# Patient Record
Sex: Male | Born: 1950 | Race: White | Hispanic: No | Marital: Married | Smoking: Never smoker
Health system: Southern US, Community
[De-identification: ages and names within clinical notes are randomized; demographics above are authoritative.]

## PROBLEM LIST (undated history)

## (undated) DIAGNOSIS — N401 Enlarged prostate with lower urinary tract symptoms: Secondary | ICD-10-CM

## (undated) DIAGNOSIS — L821 Other seborrheic keratosis: Secondary | ICD-10-CM

## (undated) DIAGNOSIS — R339 Retention of urine, unspecified: Secondary | ICD-10-CM

## (undated) DIAGNOSIS — N200 Calculus of kidney: Secondary | ICD-10-CM

## (undated) DIAGNOSIS — I10 Essential (primary) hypertension: Secondary | ICD-10-CM

## (undated) HISTORY — PX: HERNIA REPAIR: SHX51

---

## 2004-08-17 ENCOUNTER — Ambulatory Visit: Payer: Self-pay | Admitting: General Surgery

## 2004-10-30 ENCOUNTER — Ambulatory Visit: Payer: Self-pay | Admitting: Family Medicine

## 2005-11-04 ENCOUNTER — Other Ambulatory Visit: Payer: Self-pay

## 2005-11-11 ENCOUNTER — Ambulatory Visit: Payer: Self-pay | Admitting: Specialist

## 2005-12-02 ENCOUNTER — Ambulatory Visit: Payer: Self-pay | Admitting: Gastroenterology

## 2008-11-22 ENCOUNTER — Ambulatory Visit: Payer: Self-pay | Admitting: Specialist

## 2011-04-12 ENCOUNTER — Ambulatory Visit: Payer: Self-pay | Admitting: Urology

## 2011-10-01 ENCOUNTER — Emergency Department: Payer: Self-pay | Admitting: Emergency Medicine

## 2011-10-01 LAB — BASIC METABOLIC PANEL
Anion Gap: 13 (ref 7–16)
BUN: 15 mg/dL (ref 7–18)
Calcium, Total: 9.4 mg/dL (ref 8.5–10.1)
Chloride: 107 mmol/L (ref 98–107)
EGFR (African American): >60
EGFR (Non-African Amer.): >60
Glucose: 132 mg/dL — ABNORMAL HIGH (ref 65–99)
Osmolality: 282 (ref 275–301)

## 2011-10-01 LAB — URINALYSIS, COMPLETE
Bacteria: NONE SEEN
Bilirubin,UR: NEGATIVE
Glucose,UR: NEGATIVE mg/dL (ref 0–75)
Leukocyte Esterase: NEGATIVE
Nitrite: NEGATIVE
RBC,UR: 1131 /HPF (ref 0–5)
Specific Gravity: 1.019 (ref 1.003–1.030)
Squamous Epithelial: NONE SEEN
WBC UR: 1 /HPF (ref 0–5)

## 2011-10-06 ENCOUNTER — Ambulatory Visit: Payer: Self-pay | Admitting: Urology

## 2011-10-07 ENCOUNTER — Ambulatory Visit: Payer: Self-pay | Admitting: Urology

## 2011-10-14 ENCOUNTER — Ambulatory Visit: Payer: Self-pay | Admitting: Urology

## 2011-10-26 ENCOUNTER — Ambulatory Visit: Payer: Self-pay | Admitting: Urology

## 2011-11-01 ENCOUNTER — Ambulatory Visit: Payer: Self-pay | Admitting: Urology

## 2011-11-02 ENCOUNTER — Ambulatory Visit: Payer: Self-pay | Admitting: Urology

## 2012-04-12 ENCOUNTER — Ambulatory Visit: Payer: Self-pay | Admitting: Urology

## 2013-04-25 ENCOUNTER — Ambulatory Visit: Payer: Self-pay | Admitting: Urology

## 2013-05-05 IMAGING — CR DG ABDOMEN 1V
1 series · 2 of 2 positions shown · non-contrast
Comparison: none

REASON FOR EXAM: Calculus
COMMENTS:

PROCEDURE:     DXR - DXR KIDNEY URETER BLADDER  - October 14, 2011 [DATE]
RESULT:     Double-J left ureteral stent noted. Left nephrolithiasis. Tiny
stone in the mid left ureter cannot be excluded. Pelvic phleboliths.

[Series 1: supine kub · 0.17mm/px · 2 of 2 slices shown]
[im 1/2]
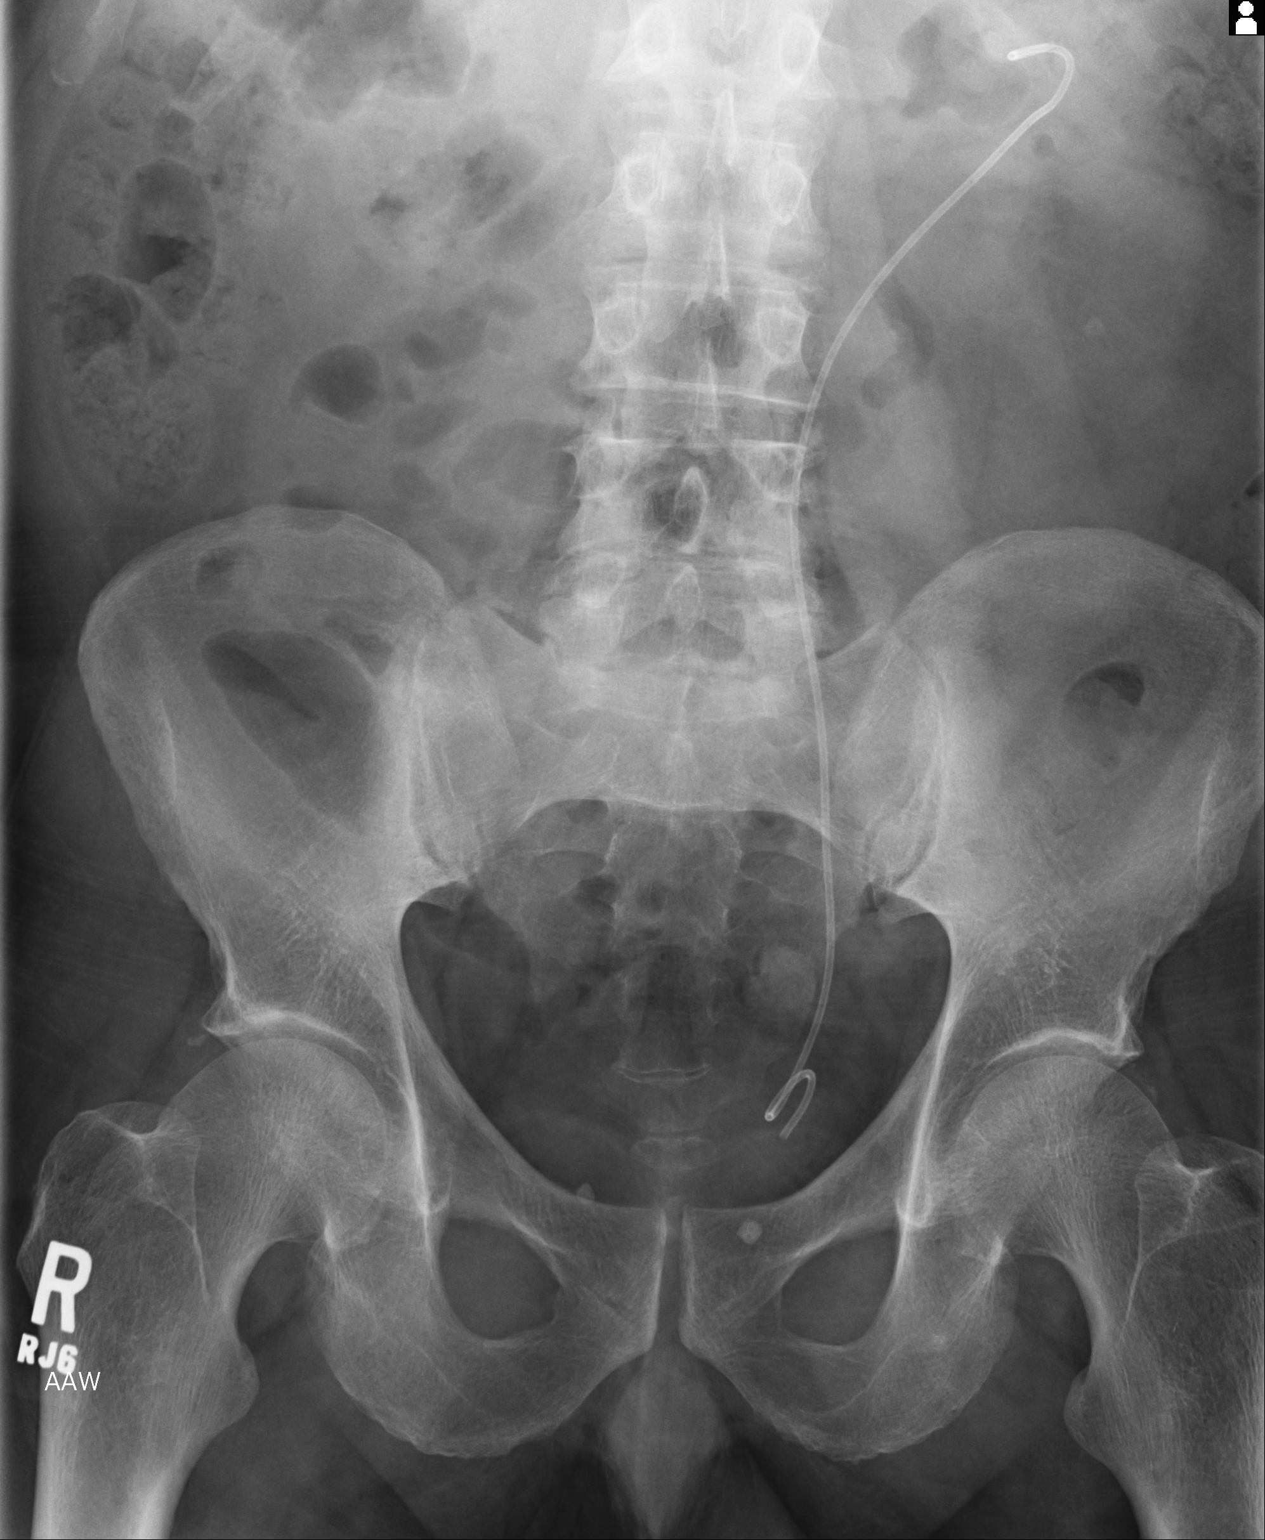
[im 2/2]
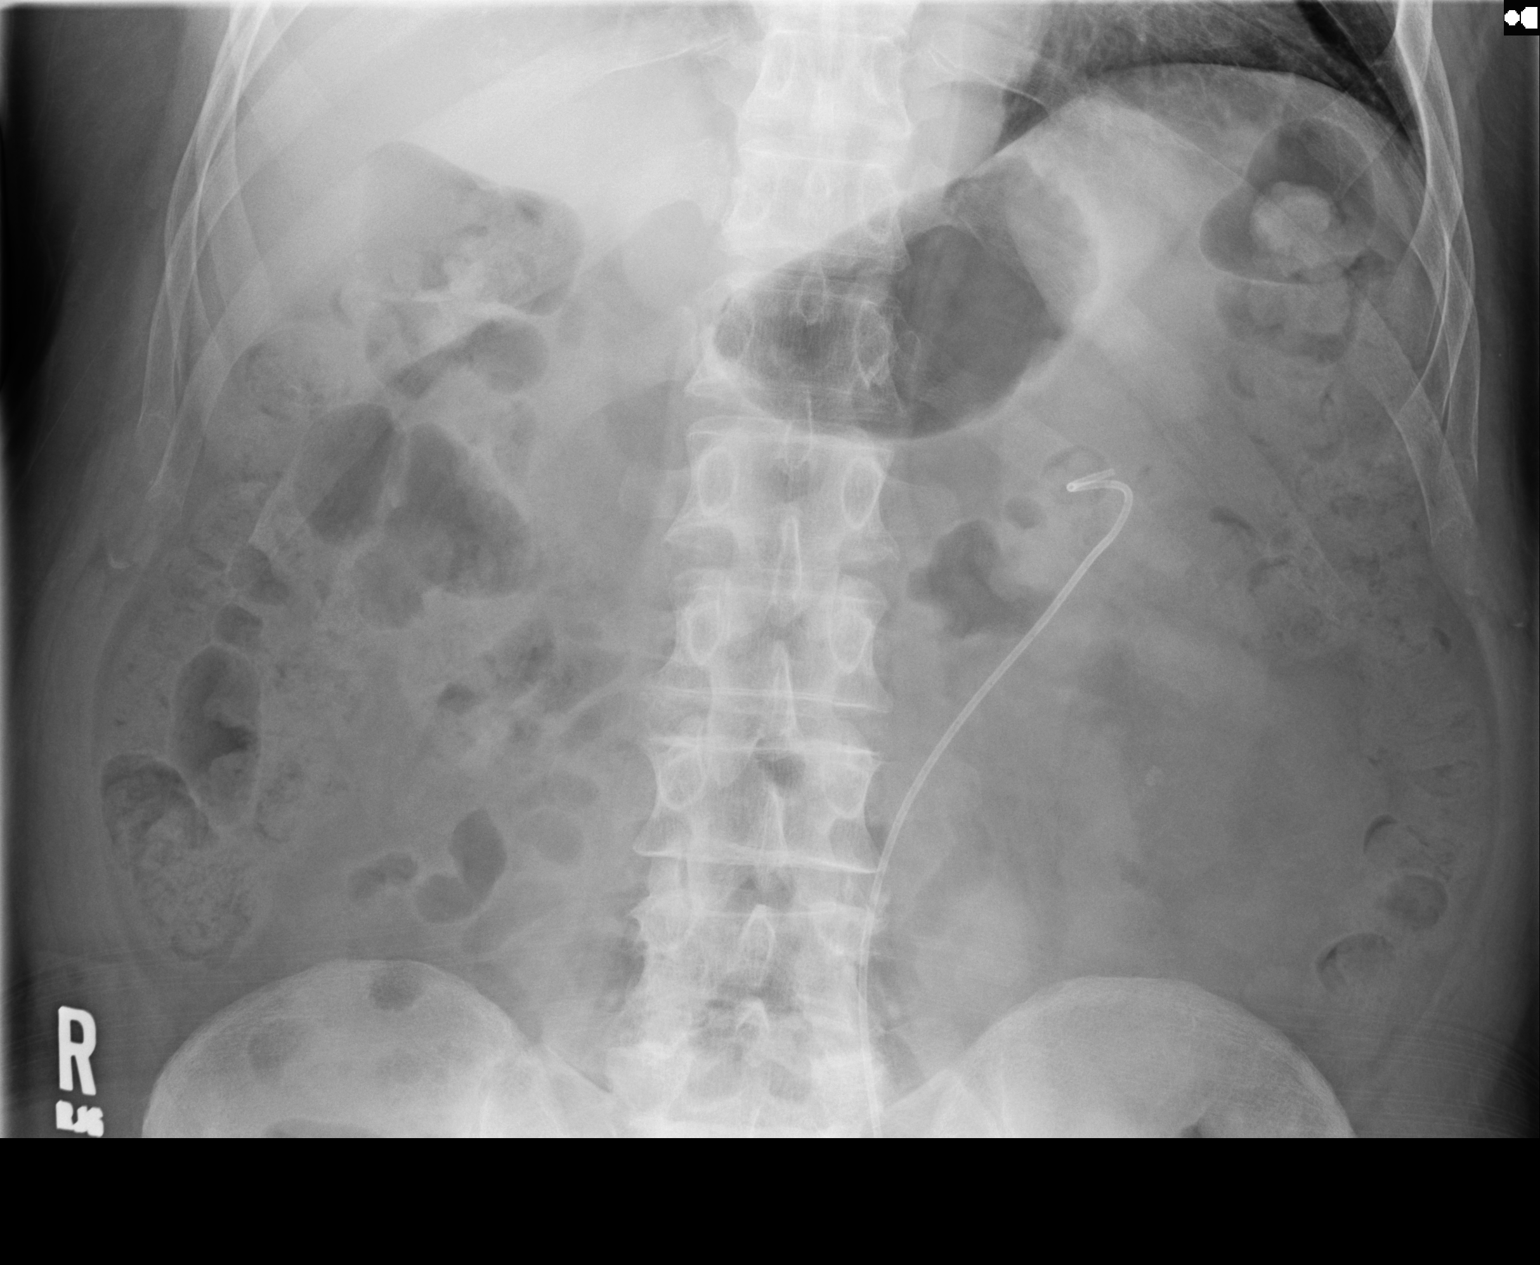

[2 of 2 positions shown; findings below may reference images not displayed]

IMPRESSION: Double-J left ureteral stent in good position. Left
nephrolithiasis. Cannot exclude tiny stone the mid left ureter.

## 2013-05-23 IMAGING — CR DG ABDOMEN 1V
1 series · 2 of 2 positions shown · non-contrast
Comparison: none

REASON FOR EXAM: Nephrolithiasis and Renal Colic
COMMENTS:

[Series 3: t abdomen supine · 0.14mm/px · 2 of 2 slices shown]
[im 1/2]
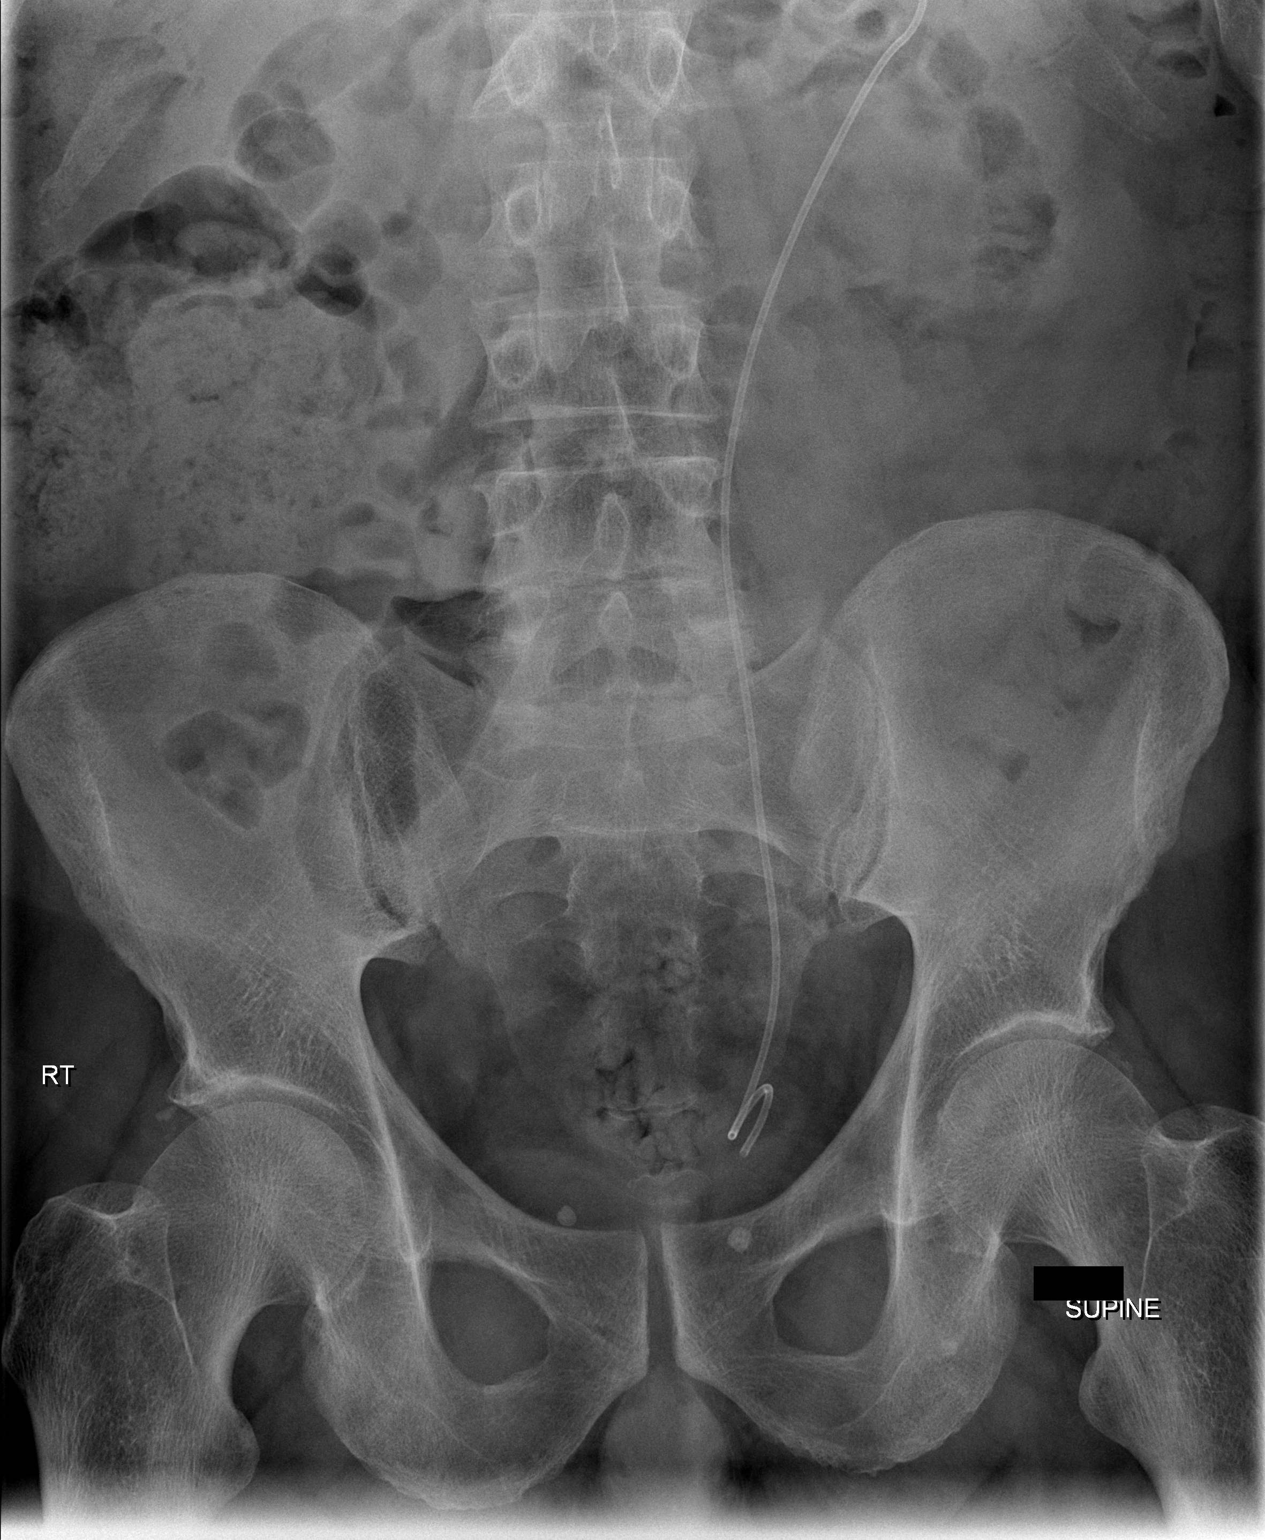
[im 2/2]
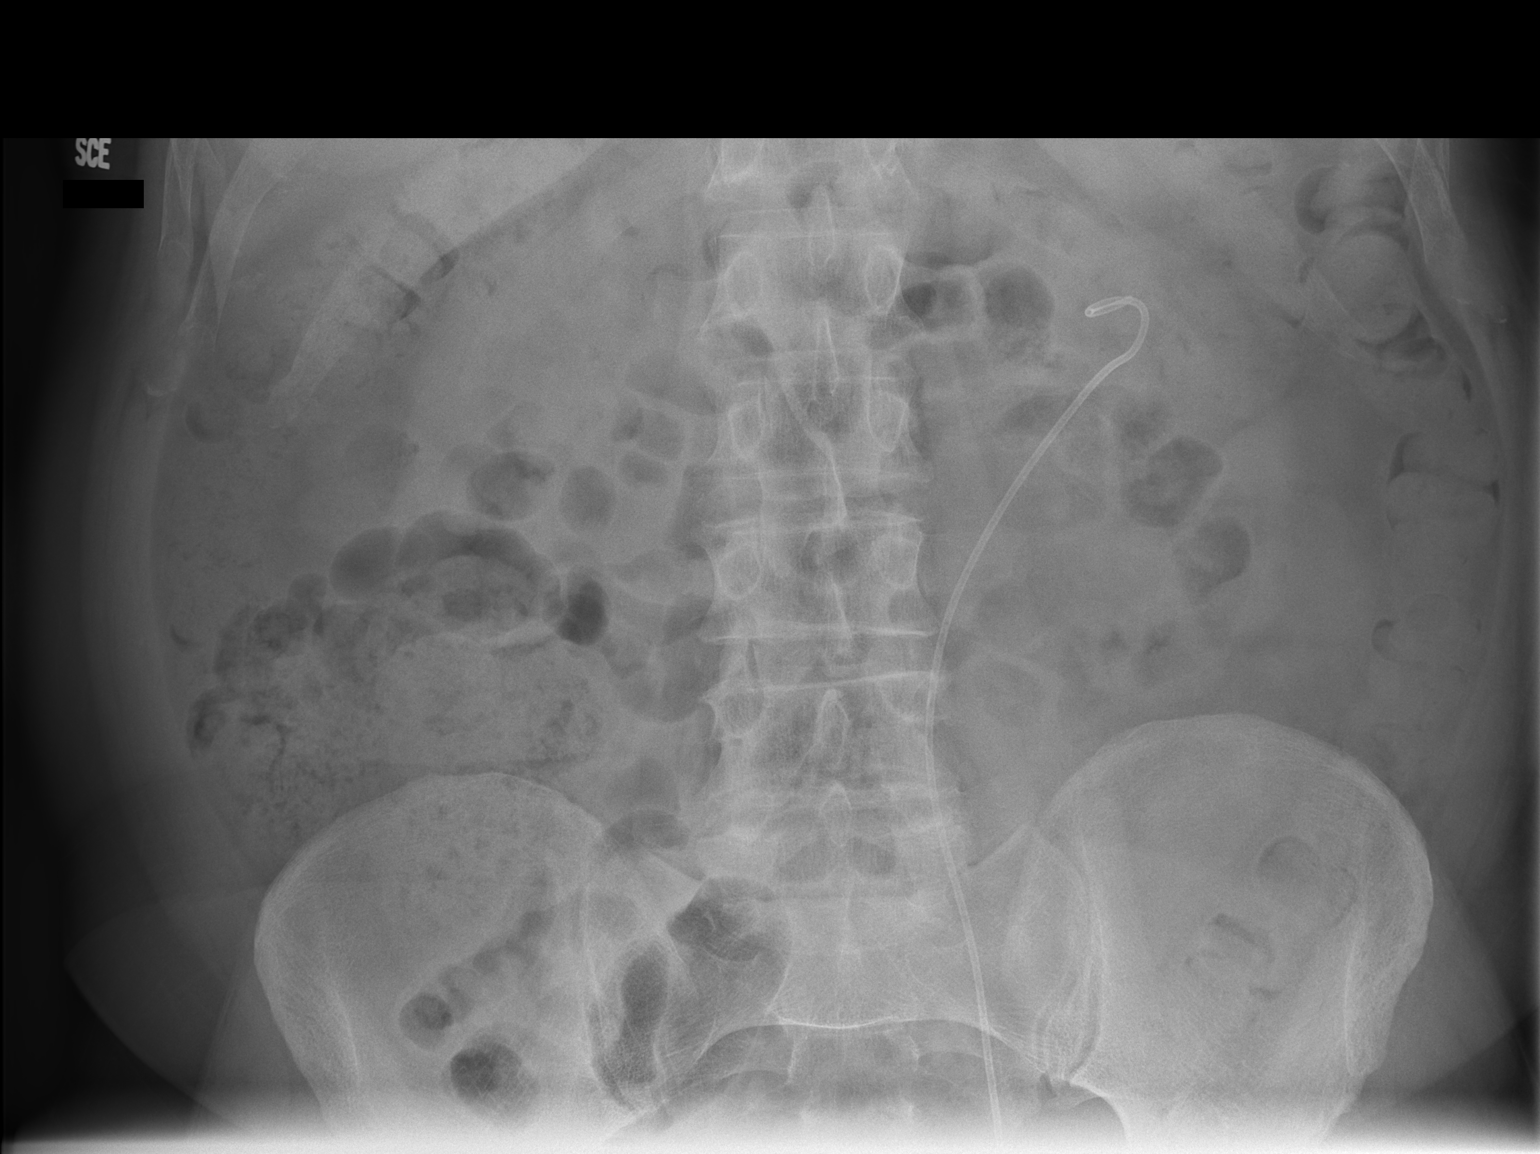

[2 of 2 positions shown; findings below may reference images not displayed]

PROCEDURE:     DXR - DXR KIDNEY URETER BLADDER  - November 01, 2011  [DATE]

RESULT:     Comparison is made to the study October 26, 2011.

A double-J ureteral stent is again demonstrated. No definite stones are
identified on the left over the kidneys or along the course of the stent nor
in the urinary bladder. There are phleboliths in the pelvis. No definite
right ureteral stones or kidney stones are demonstrated. The bowel gas
pattern suggests constipation.
IMPRESSION: A double-J ureteral stent is in place on the left. No
calcified stones are evident today.

[REDACTED]

## 2014-05-21 NOTE — Op Note (Signed)
PATIENT NAME:  Ricky Li, Ricky Li MR#:  [id number] DATE OF BIRTH:  [birth year]  DATE OF PROCEDURE:  10/07/2011  PREOPERATIVE DIAGNOSIS: Left ureterolithiasis, hydronephrosis, renal colic.   POSTOPERATIVE DIAGNOSIS: Left ureterolithiasis, hydronephrosis, renal colic.   PROCEDURE: Cystoscopy, left double-J ureteral stent placement.   SURGEON: Denice Bors. Jacqlyn Larsen, MD  ANESTHESIA: Laryngeal mask airway anesthesia.   INDICATIONS: The patient is a 64 year old white gentleman with a history of nephrolithiasis. He presented to the Emergency Room on Friday with left-sided flank pain, nausea, and vomiting as well as hematuria. A CT scan at that time demonstrated a 5.5 mm stone in the proximal left ureter. On evaluation yesterday in the office the stone was noted to be back in the kidney. He developed recurrent pain last evening. He presents for stent placement in preparation for ESWL scheduled next week.   DESCRIPTION OF PROCEDURE: After informed consent was obtained, the patient was taken to the Operating Room and placed in the dorsal lithotomy position under laryngeal mask airway anesthesia. The patient was then prepped and draped in the usual standard fashion. The 21 French rigid cystoscope was introduced into the urethra under direct vision with no urethral abnormalities noted. Upon entering the prostatic fossa, moderate bilobar hypertrophy was noted with visual obstruction noted. Upon entering the bladder, the mucosa was inspected in its entirety with no gross mucosal lesions noted. Bilateral ureteral orifices were well visualized with no lesions noted. A flexible tip Glidewire was introduced into the left ureteral orifice. It was advanced into the upper pole collecting system without difficulty. No significant resistance was met. The stone could be visualized at the UPJ region. A 6 French x 26 cm double-J ureteral stent was advanced over the guidewire into the right renal collecting system without difficulty. The  stone was noted to move with stent placement. It remained, however, in the region of the renal pelvis. Upon withdrawal of the guidewire, adequate curl was noted within the renal pelvis. Adequate curl was also noted within the urinary bladder. A significant amount of purulent debris was noted to drain from the stent once placed. The bladder was drained. The cystoscope was removed. The patient was returned to the supine position and awakened from laryngeal mask airway anesthesia. He was taken to the recovery room in stable condition. There were no problems or complications. The patient tolerated the procedure well.    ____________________________ Denice Bors. Jacqlyn Larsen, MD bsc:cms D: 10/07/2011 12:04:23 ET T: 10/07/2011 12:28:27 ET JOB#: [id number]  cc: Denice Bors. Jacqlyn Larsen, MD, <Dictator> Denice Bors Delmos Velaquez MD ELECTRONICALLY SIGNED 10/07/2011 22:00

## 2014-05-21 NOTE — Op Note (Signed)
PATIENT NAME:  Ricky Li, Ricky Li MR#:  [id number] DATE OF BIRTH:  09-02-1950  DATE OF PROCEDURE:  11/02/2011  PREOPERATIVE DIAGNOSIS: Left ureterolithiasis.   POSTOPERATIVE DIAGNOSIS: Left ureterolithiasis.   PROCEDURE PERFORMED: Left ureteroscopy with holmium laser lithotripsy, left double-J ureteral stent removal.   SURGEON: Assunta Gambles, M.D.   ANESTHESIA: General endotracheal anesthesia.   INDICATION: The patient is a 64 year old gentleman with a history of nephrolithiasis and multiple stones. He had dropped a stone into the proximal left ureter recently. He underwent stent placement due to obstruction. He underwent subsequent ESWL. He was being prepared for stent removal. A KUB however demonstrated movement of a previously noted 6 mm stone in the lower pole portion of the left kidney to the same approximate location in the left proximal ureter. Due to the stone size, stent removal would not likely result in stone passage. The decision was made to proceed with ureteroscopic stone removal and further evaluation of the kidney for any residual stones.   DESCRIPTION OF PROCEDURE: After informed consent was obtained, the patient was taken to the Operating Room and placed in the dorsal lithotomy position under general endotracheal anesthesia. The patient was then prepped and draped in the usual standard fashion. The 22-French rigid cystoscope was introduced into the urethra under direct vision with no urethral abnormalities noted. Upon entering the prostatic fossa, moderate bilobar hypertrophy was noted with partial visual obstruction. Upon entering the bladder, the mucosa was inspected in its entirety with no gross mucosal lesions noted. Bilateral ureteral orifices were well visualized with no lesions noted. Moderate inflammation was noted however around left ureteral orifice consistent with a stent. The stent was noted protruding from the left ureteral orifice. Grasping forceps were utilized. The stent was  removed without difficulty. A flexible tip Glidewire was introduced into the left ureteral orifice. It was advanced into the upper pole collecting system under fluoroscopic guidance without difficulty. The cystoscope was removed. The 6 French rigid ureteroscope was advanced into the urinary bladder. It was easily advanced into the left ureteral orifice to the level of the proximal ureter. A stone was noted in an area of slight erythema. The holmium laser fiber was then utilized to fragment the stone into multiple larger pieces. These were basket extracted. They were collected and will be sent for stone analysis. The scope was then advanced into the renal pelvis. No additional stones were noted. Some slight clot was present within the renal pelvis. The decision was made to proceed with further evaluation for any additional stones. The rigid ureteroscope was removed. The flexible ureteroscope was advanced over the previously placed guidewire. It was easily advanced without a sheath into the renal pelvic region under direct vision and fluoroscopic guidance. The renal pelvis demonstrated areas of mild inflammation consistent with stent placement. There was no evidence of stone. The small amount of clot could be easily irrigated free. The upper, lateral, and lower pole calyces were then inspected sequentially. A small Randall's plaque was noted in a lower pole calyx. There was no  evidence of stone or other abnormalities within the kidney. The scope was removed. The ureter was re-visualized throughout the entire course. The ureter appeared to be adequately open. The decision was made that a stent would not be replaced. The ureteroscope was completely removed. The cystoscope was replaced back into the urinary bladder. The stone fragments were irrigated free. The bladder was drained. The cystoscope was removed. The patient was returned to the supine position and awakened from general endotracheal  anesthesia. He was taken  to the recovery room in stable condition. There were no problems or complications. The patient tolerated the procedure well.  ____________________________ Madolyn Frieze. Achilles Dunk, MD bsc:slb D: 11/02/2011 14:05:18 ET T: 11/02/2011 15:57:55 ET JOB#: [id number]  cc: Madolyn Frieze. Achilles Dunk, MD, <Dictator> Madolyn Frieze Takumi Din MD ELECTRONICALLY SIGNED 11/02/2011 16:35

## 2014-08-30 ENCOUNTER — Ambulatory Visit
Admission: EM | Admit: 2014-08-30 | Discharge: 2014-08-30 | Disposition: A | Discharge status: Home / Self Care | Payer: No Typology Code available for payment source | Attending: Internal Medicine | Admitting: Internal Medicine

## 2014-08-30 ENCOUNTER — Encounter: Payer: Self-pay | Admitting: Gynecology

## 2014-08-30 DIAGNOSIS — L259 Unspecified contact dermatitis, unspecified cause: Secondary | ICD-10-CM

## 2014-08-30 DIAGNOSIS — L509 Urticaria, unspecified: Secondary | ICD-10-CM | POA: Diagnosis not present

## 2014-08-30 HISTORY — DX: Calculus of kidney: N20.0

## 2014-08-30 MED ORDER — PREDNISONE 10 MG PO TABS: ORAL_TABLET | ORAL | Status: DC

## 2014-08-30 MED ORDER — PREDNISONE 20 MG PO TABS: 40.0000 mg | ORAL_TABLET | Freq: Every day | ORAL | Status: DC

## 2014-08-30 NOTE — Discharge Instructions (Signed)
Prednisone  20 mg tablets 2 by mouth every morning for 5 days  Zantac 75 mg ...take 150 mg  Twice a day for 5 days  Zyrtec  10 mg   1 tablet  twice daily for 5 days  Aveeno Oatmeal Colloid bath if desired.  Benadryl 25 mg 1-2 tablets at bedtime if needed for itch /sleep    Contact Dermatitis Contact dermatitis is a reaction to certain substances that touch the skin. Contact dermatitis can be either irritant contact dermatitis or allergic contact dermatitis. Irritant contact dermatitis does not require previous exposure to the substance for a reaction to occur.Allergic contact dermatitis only occurs if you have been exposed to the substance before. Upon a repeat exposure, your body reacts to the substance.  CAUSES  Many substances can cause contact dermatitis. Irritant dermatitis is most commonly caused by repeated exposure to mildly irritating substances, such as:  Makeup.  Soaps.  Detergents.  Bleaches.  Acids.  Metal salts, such as nickel. Allergic contact dermatitis is most commonly caused by exposure to:  Poisonous plants.  Chemicals (deodorants, shampoos).  Jewelry.  Latex.  Neomycin in triple antibiotic cream.  Preservatives in products, including clothing. SYMPTOMS  The area of skin that is exposed may develop:  Dryness or flaking.  Redness.  Cracks.  Itching.  Pain or a burning sensation.  Blisters. With allergic contact dermatitis, there may also be swelling in areas such as the eyelids, mouth, or genitals.  DIAGNOSIS  Your caregiver can usually tell what the problem is by doing a physical exam. In cases where the cause is uncertain and an allergic contact dermatitis is suspected, a patch skin test may be performed to help determine the cause of your dermatitis. TREATMENT Treatment includes protecting the skin from further contact with the irritating substance by avoiding that substance if possible. Barrier creams, powders, and gloves may be  helpful. Your caregiver may also recommend:  Steroid creams or ointments applied 2 times daily. For best results, soak the rash area in cool water for 20 minutes. Then apply the medicine. Cover the area with a plastic wrap. You can store the steroid cream in the refrigerator for a "chilly" effect on your rash. That may decrease itching. Oral steroid medicines may be needed in more severe cases.  Antibiotics or antibacterial ointments if a skin infection is present.  Antihistamine lotion or an antihistamine taken by mouth to ease itching.  Lubricants to keep moisture in your skin.  Burow's solution to reduce redness and soreness or to dry a weeping rash. Mix one packet or tablet of solution in 2 cups cool water. Dip a clean washcloth in the mixture, wring it out a bit, and put it on the affected area. Leave the cloth in place for 30 minutes. Do this as often as possible throughout the day.  Taking several cornstarch or baking soda baths daily if the area is too large to cover with a washcloth. Harsh chemicals, such as alkalis or acids, can cause skin damage that is like a burn. You should flush your skin for 15 to 20 minutes with cold water after such an exposure. You should also seek immediate medical care after exposure. Bandages (dressings), antibiotics, and pain medicine may be needed for severely irritated skin.  HOME CARE INSTRUCTIONS  Avoid the substance that caused your reaction.  Keep the area of skin that is affected away from hot water, soap, sunlight, chemicals, acidic substances, or anything else that would irritate your skin.  Do  not scratch the rash. Scratching may cause the rash to become infected.  You may take cool baths to help stop the itching.  Only take over-the-counter or prescription medicines as directed by your caregiver.  See your caregiver for follow-up care as directed to make sure your skin is healing properly. SEEK MEDICAL CARE IF:   Your condition is not  better after 3 days of treatment.  You seem to be getting worse.  You see signs of infection such as swelling, tenderness, redness, soreness, or warmth in the affected area.  You have any problems related to your medicines. Document Released: 01/16/2000 Document Revised: 04/12/2011 Document Reviewed: 06/23/2010 [employer] Patient Information 2015 [place], [place]. This information is not intended to replace advice given to you by your health care provider. Make sure you discuss any questions you have with your health care provider. edtime if needed for sleep

## 2014-08-30 NOTE — ED Notes (Signed)
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

## 2014-08-30 NOTE — ED Provider Notes (Signed)
CSN: [id number]     Arrival date & time 08/30/14  1634 History   First MD Initiated Contact with Patient 08/30/14 1657     Chief Complaint  Patient presents with  . Poison Ivy   (Consider location/radiation/quality/duration/timing/severity/associated sxs/prior Treatment) HPI  64 yo M presents with new onset red inflamed skin he refers to as "poison ivy" . 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 Denies any known chemical exposure. No new medications. No change soaps, detergents, creams, lotions.deodorants. First aware yesterday afternoon when he came in from fields. Mild itchy area. Today the  same areas are inflamed, irritated pruritic. Reports armpits, waistline and lower legs. Interestingly the highest involvement is in the warmest, most covered areas. No exposed skin appears to have been included in the outbreak- forearms and hands, neck, ear and face have been spared.  Past Medical History  Diagnosis Date  . Kidney stones    Past Surgical History  Procedure Laterality Date  . Hernia repair     History reviewed. No pertinent family history. History  Substance Use Topics  . Smoking status: Never Smoker   . Smokeless tobacco: Not on file  . Alcohol Use: Yes    Review of Systems Constitutional: No fever.  Eyes: No visual changes. ENT:No sore throat. Cardiovascular:Negative for chest pain/palpitations Respiratory: Negative for shortness of breath Gastrointestinal: No abdominal pain. No nausea,vomiting, diarrhea Genitourinary: Negative for dysuria. Normal urination. Musculoskeletal: Negative for back pain. FROM extremities without pain Skin: See HPI/PE Neurological: Negative for headache, focal weakness or numbness, denies anxiety reqarding the current problem  Allergies  Review of patient's allergies indicates no known allergies.  Home Medications   Prior to Admission medications   Medication Sig Start Date End Date Taking? Authorizing Provider   Multiple Vitamins-Minerals (CENTRUM SILVER PO) Take by mouth.   Yes Historical Provider, MD  predniSONE (DELTASONE) 20 MG tablet Take 2 tablets (40 mg total) by mouth daily. 08/30/14   Rae Halsted, PA-C   BP 96/68 mmHg  Pulse 71  Temp(Src) 98.2 F (36.8 C) (Oral)  Resp 18  Ht 6' (1.829 m)  Wt 225 lb (102.059 kg)  BMI 30.51 kg/m2  SpO2 97% Physical Exam    Constitutional -alert and oriented,well appearing, mild/mod distress from inflamed areas Head-atraumatic, normocephalic Eyes- conjunctiva normal, EOMI ,conjugate gaze Nose- no congestion or rhinorrhea Mouth/throat- mucous membranes moist , Neck- supple without glandular enlargement CV- regular rate, grossly normal heart sounds,  Resp-no distress, normal respiratory effort,clear to auscultation bilaterally GI- soft,non-tender,no distention GU-  not examined Neuro-normal speech and language, no gross focal neurological deficit appreciated, no gait instability, Skin- the bilateral axilla reveal  inflamed. annular edematous plaques mildly erythematous  Extending from within the axilla to the lateral chest wall and upper chest  bilaterally. He has a similar area of  uriticaria surrounding the inverted  umbilicus onto the abdominal wall. His waistline inside his belt and jeans waistband is significantly involved and  inflamed as well, though it decreases as it traverses the body from anterior abdomen to flank and then  to his back where there is essentially no involvement. There are no other areas with similar outbreak Bilateral lower extremities have  Tiny red lesions appearing to be insect bites along the top of his sock line on each shin-very different from the large urticarial plaques previously identified. Psych-mood and affect grossly normal; speech and behavior grossly normal  ED Course  Procedures (including critical care time) Labs Review Labs Reviewed - No  data to display  Imaging Review No results found.  Extensively  reviewed possible household, dietary, farmstead, chemical possibilities with the patient and his wife and cannot identify a new exposure or possible skin contaminant. He denies any new medications, foods, cosmetics, creamsi  MDM   1. Urticaria   2. Contact dermatitis    Plan: 1. Impressions and diagnosis reviewed with patient and wife; will continue to seek possible inflammatory agent, For the present seek to relieve symtoms, 2. Rx as per orders with Prednisone 40mg  daily x 5 days po, Zantac 150 mg BID, Zyrtec 10mg  BID, Benadry 25-50 mg HS as needed for discomfort/itch. ; risks, benefits, potential side effects reviewed with patient 3. Recommend supportive treatment with Aveeno Oatmeal colloid Baths; tepid showers 4. F/u prn if symptoms worsen or don't improve     Rae Halsted, PA-C 09/02/14 0002

## 2017-12-01 ENCOUNTER — Encounter: Payer: Self-pay | Admitting: Emergency Medicine

## 2017-12-01 ENCOUNTER — Ambulatory Visit
Admission: EM | Admit: 2017-12-01 | Discharge: 2017-12-01 | Disposition: A | Discharge status: Home / Self Care | Payer: No Typology Code available for payment source | Attending: Family Medicine | Admitting: Family Medicine

## 2017-12-01 ENCOUNTER — Other Ambulatory Visit: Payer: Self-pay

## 2017-12-01 DIAGNOSIS — J069 Acute upper respiratory infection, unspecified: Secondary | ICD-10-CM | POA: Diagnosis not present

## 2017-12-01 DIAGNOSIS — B9789 Other viral agents as the cause of diseases classified elsewhere: Secondary | ICD-10-CM | POA: Diagnosis not present

## 2017-12-01 MED ORDER — IPRATROPIUM BROMIDE 0.06 % NA SOLN: 2.0000 | Freq: Four times a day (QID) | NASAL | 0 refills | Status: DC | PRN

## 2017-12-01 MED ORDER — HYDROCOD POLST-CPM POLST ER 10-8 MG/5ML PO SUER: 5.0000 mL | Freq: Two times a day (BID) | ORAL | 0 refills | Status: DC | PRN

## 2017-12-01 MED ORDER — PREDNISONE 50 MG PO TABS: ORAL_TABLET | ORAL | 0 refills | Status: DC

## 2017-12-01 NOTE — Discharge Instructions (Signed)
Rest, fluids. ° °Medication as prescribed. ° °Take care ° °Dr. Tomothy Eddins  °

## 2017-12-01 NOTE — ED Triage Notes (Signed)
Patient c/o cough and nasal congestion that started Sunday. Patient stated his cough is worse at night and he is unable to sleep. Patient has tried Nyquil and Delsym DM and nasal spray with no relief.

## 2017-12-01 NOTE — ED Provider Notes (Signed)
[facility]    CSN: [id number] Arrival date & time: 12/01/17  1257  History   Chief Complaint Chief Complaint  Patient presents with  . Cough  . Nasal Congestion   HPI  67 year old male presents with respiratory symptoms.  Started on Sunday.  Reports productive cough, nasal congestion.  Worse at night.  Difficulty sleeping.  Has tried NyQuil and Delsym without improvement.  Has also tried some nasal spray without resolution.  His wife is also sick.  Symptoms are severe.  No relieving factors.  No other reported symptoms.  No other complaints.  History reviewed and updated as below.  Past Medical History:  Diagnosis Date  . Kidney stones    Past Surgical History:  Procedure Laterality Date  . HERNIA REPAIR     Home Medications    Prior to Admission medications   Medication Sig Start Date End Date Taking? Authorizing Provider  chlorpheniramine-HYDROcodone (TUSSIONEX PENNKINETIC ER) 10-8 MG/5ML SUER Take 5 mLs by mouth every 12 (twelve) hours as needed. 12/01/17   Everlene Other G, DO  ipratropium (ATROVENT) 0.06 % nasal spray Place 2 sprays into both nostrils 4 (four) times daily as needed for rhinitis. 12/01/17   Tommie Sams, DO  predniSONE (DELTASONE) 50 MG tablet 1 tablet daily x 5 days 12/01/17   Tommie Sams, DO   Social History Social History   Tobacco Use  . Smoking status: Never Smoker  . Smokeless tobacco: Never Used  Substance Use Topics  . Alcohol use: Yes    Comment: socially  . Drug use: No     Allergies   Patient has no known allergies.   Review of Systems Review of Systems  Constitutional: Negative for fever.  HENT: Positive for congestion.   Respiratory: Positive for cough.    Physical Exam Triage Vital Signs ED Triage Vitals  Enc Vitals Group     BP 12/01/17 1315 (!) 133/98     Pulse Rate 12/01/17 1315 68     Resp 12/01/17 1315 18     Temp 12/01/17 1315 98.4 F (36.9 C)     Temp Source 12/01/17 1315 Oral     SpO2  12/01/17 1315 98 %     Weight 12/01/17 1316 230 lb (104.3 kg)     Height 12/01/17 1316 6' (1.829 m)     Head Circumference --      Peak Flow --      Pain Score 12/01/17 1316 0     Pain Loc --      Pain Edu? --      Excl. in GC? --    Updated Vital Signs BP (!) 133/98 (BP Location: Left Arm)   Pulse 68   Temp 98.4 F (36.9 C) (Oral)   Resp 18   Ht 6' (1.829 m)   Wt 104.3 kg   SpO2 98%   BMI 31.19 kg/m   Visual Acuity Right Eye Distance:   Left Eye Distance:   Bilateral Distance:    Right Eye Near:   Left Eye Near:    Bilateral Near:     Physical Exam  Constitutional: He is oriented to person, place, and time. He appears well-developed. No distress.  HENT:  Head: Normocephalic.  Mouth/Throat: Oropharynx is clear and moist.  Cardiovascular: Normal rate and regular rhythm.  Pulmonary/Chest: Effort normal and breath sounds normal. He has no wheezes. He has no rales.  Neurological: He is alert and oriented to person, place, and time.  Psychiatric: He  has a normal mood and affect. His behavior is normal.  Nursing note and vitals reviewed.  UC Treatments / Results  Labs (all labs ordered are listed, but only abnormal results are displayed) Labs Reviewed - No data to display  EKG None  Radiology No results found.  Procedures Procedures (including critical care time)  Medications Ordered in UC Medications - No data to display  Initial Impression / Assessment and Plan / UC Course  I have reviewed the triage vital signs and the nursing notes.  Pertinent labs & imaging results that were available during my care of the patient were reviewed by me and considered in my medical decision making (see chart for details).    67 year old male presents with a viral URI with cough.  Treating with Atrovent nasal spray, Tussionex, prednisone.  Final Clinical Impressions(s) / UC Diagnoses   Final diagnoses:  Viral URI with cough     Discharge Instructions     Rest,  fluids.  Medication as prescribed.  Take care  Dr. Adriana Simas    ED Prescriptions    Medication Sig Dispense Auth. Provider   ipratropium (ATROVENT) 0.06 % nasal spray Place 2 sprays into both nostrils 4 (four) times daily as needed for rhinitis. 15 mL Shakea Isip G, DO   predniSONE (DELTASONE) 50 MG tablet 1 tablet daily x 5 days 5 tablet Jaquilla Woodroof G, DO   chlorpheniramine-HYDROcodone (TUSSIONEX PENNKINETIC ER) 10-8 MG/5ML SUER Take 5 mLs by mouth every 12 (twelve) hours as needed. 115 mL Tommie Sams, DO     Controlled Substance Prescriptions [place] Controlled Substance Registry consulted? Not Applicable   Tommie Sams, DO 12/01/17 1447

## 2017-12-11 ENCOUNTER — Other Ambulatory Visit: Payer: Self-pay

## 2017-12-11 ENCOUNTER — Emergency Department: Payer: Medicare Other

## 2017-12-11 ENCOUNTER — Emergency Department
Admission: EM | Admit: 2017-12-11 | Discharge: 2017-12-11 | Disposition: A | Discharge status: Home / Self Care | Payer: Medicare Other | Attending: Emergency Medicine | Admitting: Emergency Medicine

## 2017-12-11 DIAGNOSIS — N200 Calculus of kidney: Secondary | ICD-10-CM | POA: Diagnosis not present

## 2017-12-11 DIAGNOSIS — Z79899 Other long term (current) drug therapy: Secondary | ICD-10-CM | POA: Diagnosis not present

## 2017-12-11 DIAGNOSIS — R109 Unspecified abdominal pain: Secondary | ICD-10-CM | POA: Diagnosis present

## 2017-12-11 LAB — CBC WITH DIFFERENTIAL/PLATELET
ABS IMMATURE GRANULOCYTES: 0.24 10*3/uL — AB (ref 0.00–0.07)
Basophils Absolute: 0.1 10*3/uL (ref 0.0–0.1)
Basophils Relative: 1 %
Eosinophils Absolute: 0.2 10*3/uL (ref 0.0–0.5)
Eosinophils Relative: 2 %
HEMATOCRIT: 48.5 % (ref 39.0–52.0)
Hemoglobin: 16.4 g/dL (ref 13.0–17.0)
Immature Granulocytes: 2 %
LYMPHS ABS: 1.4 10*3/uL (ref 0.7–4.0)
Lymphocytes Relative: 15 %
MCH: 30.7 pg (ref 26.0–34.0)
MCHC: 33.8 g/dL (ref 30.0–36.0)
MCV: 90.7 fL (ref 80.0–100.0)
MONO ABS: 0.5 10*3/uL (ref 0.1–1.0)
MONOS PCT: 6 %
NEUTROS ABS: 7.4 10*3/uL (ref 1.7–7.7)
NEUTROS PCT: 74 %
Platelets: 215 10*3/uL (ref 150–400)
RBC: 5.35 MIL/uL (ref 4.22–5.81)
RDW: 12.6 % (ref 11.5–15.5)
WBC: 9.8 10*3/uL (ref 4.0–10.5)
nRBC: 0 % (ref 0.0–0.2)

## 2017-12-11 LAB — URINALYSIS, COMPLETE (UACMP) WITH MICROSCOPIC
BACTERIA UA: NONE SEEN
BILIRUBIN URINE: NEGATIVE
Glucose, UA: NEGATIVE mg/dL
KETONES UR: 5 mg/dL — AB
LEUKOCYTES UA: NEGATIVE
NITRITE: NEGATIVE
Protein, ur: NEGATIVE mg/dL
SPECIFIC GRAVITY, URINE: 1.013 (ref 1.005–1.030)
pH: 6 (ref 5.0–8.0)

## 2017-12-11 LAB — BASIC METABOLIC PANEL
ANION GAP: 9 (ref 5–15)
BUN: 19 mg/dL (ref 8–23)
CHLORIDE: 104 mmol/L (ref 98–111)
CO2: 25 mmol/L (ref 22–32)
Calcium: 8.7 mg/dL — ABNORMAL LOW (ref 8.9–10.3)
Creatinine, Ser: 0.88 mg/dL (ref 0.61–1.24)
GFR calc Af Amer: >60 mL/min (ref >60)
GFR calc non Af Amer: >60 mL/min (ref >60)
GLUCOSE: 154 mg/dL — AB (ref 70–99)
POTASSIUM: 4.4 mmol/L (ref 3.5–5.1)
Sodium: 138 mmol/L (ref 135–145)

## 2017-12-11 LAB — TROPONIN I: Troponin I: <0.03 ng/mL (ref <0.03)

## 2017-12-11 MED ORDER — ONDANSETRON HCL 4 MG/2ML IJ SOLN
4.0000 mg | Freq: Once | INTRAMUSCULAR | Status: AC
  Administered 2017-12-11: 4 mg via INTRAVENOUS

## 2017-12-11 MED ORDER — KETOROLAC TROMETHAMINE 30 MG/ML IJ SOLN
INTRAMUSCULAR | Status: AC
  Administered 2017-12-11: 09:00:00
  Filled 2017-12-11: qty 1

## 2017-12-11 MED ORDER — SODIUM CHLORIDE 0.9 % IV BOLUS
500.0000 mL | Freq: Once | INTRAVENOUS | Status: AC
  Administered 2017-12-11: 500 mL via INTRAVENOUS

## 2017-12-11 MED ORDER — OXYCODONE-ACETAMINOPHEN 5-325 MG PO TABS: 1.0000 | ORAL_TABLET | ORAL | 0 refills | Status: AC | PRN

## 2017-12-11 MED ORDER — ONDANSETRON HCL 4 MG/2ML IJ SOLN
INTRAMUSCULAR | Status: AC
  Filled 2017-12-11: qty 2

## 2017-12-11 MED ORDER — KETOROLAC TROMETHAMINE 30 MG/ML IJ SOLN
30.0000 mg | Freq: Once | INTRAMUSCULAR | Status: AC
  Administered 2017-12-11: 30 mg via INTRAVENOUS

## 2017-12-11 MED ORDER — OXYCODONE-ACETAMINOPHEN 5-325 MG PO TABS
1.0000 | ORAL_TABLET | Freq: Once | ORAL | Status: AC
  Administered 2017-12-11: 1 via ORAL
  Filled 2017-12-11: qty 1

## 2017-12-11 MED ORDER — TAMSULOSIN HCL 0.4 MG PO CAPS: 0.4000 mg | ORAL_CAPSULE | Freq: Every day | ORAL | 0 refills | Status: DC

## 2017-12-11 NOTE — ED Provider Notes (Signed)
[facility] Emergency Department Provider Note  ____________________________________________   First MD Initiated Contact with Patient 12/11/17 [phone]     (approximate)  I have reviewed the triage vital signs and the nursing notes.   HISTORY  Chief Complaint Back Pain   HPI Ricky Li is a 67 y.o. male with a history of kidney stones as well as recent upper respiratory infection was presenting with left flank pain.  EMS reported the patient was having chest pain as well.  However, the patient says the pain is not radiating to his chest but from the left lower flank around the left lower abdomen.  He describes the pain is sharp and only a 1 out of 10 at this time.  However, he says that he had increased rapidly from about 7 AM and this is why he drove himself to a local EMS station and return was brought to the emergency department.  Patient has not had any pain meds at this morning. Continues to deny any chest pain or shortness of breath.  Past Medical History:  Diagnosis Date  . Kidney stones     There are no active problems to display for this patient.   Past Surgical History:  Procedure Laterality Date  . HERNIA REPAIR      Prior to Admission medications   Medication Sig Start Date End Date Taking? Authorizing Provider  chlorpheniramine-HYDROcodone (TUSSIONEX PENNKINETIC ER) 10-8 MG/5ML SUER Take 5 mLs by mouth every 12 (twelve) hours as needed. 12/01/17   Everlene Other G, DO  ipratropium (ATROVENT) 0.06 % nasal spray Place 2 sprays into both nostrils 4 (four) times daily as needed for rhinitis. 12/01/17   Tommie Sams, DO  predniSONE (DELTASONE) 50 MG tablet 1 tablet daily x 5 days 12/01/17   Tommie Sams, DO    Allergies Patient has no known allergies.  History reviewed. No pertinent family history.  Social History Social History   Tobacco Use  . Smoking status: Never Smoker  . Smokeless tobacco: Never Used  Substance Use Topics  .  Alcohol use: Yes    Comment: socially  . Drug use: No    Review of Systems  Constitutional: No fever/chills Eyes: No visual changes. ENT: No sore throat. Cardiovascular: Denies chest pain. Respiratory: Denies shortness of breath. Gastrointestinal: No nausea, no vomiting.  No diarrhea.  No constipation. Genitourinary: Negative for dysuria. Musculoskeletal: As above Skin: Negative for rash. Neurological: Negative for headaches, focal weakness or numbness.   ____________________________________________   PHYSICAL EXAM:  VITAL SIGNS: ED Triage Vitals  Enc Vitals Group     BP      Pulse      Resp      Temp      Temp src      SpO2      Weight      Height      Head Circumference      Peak Flow      Pain Score      Pain Loc      Pain Edu?      Excl. in GC?     Constitutional: Alert and oriented. Well appearing and in no acute distress. Eyes: Conjunctivae are normal.  Head: Atraumatic. Nose: No congestion/rhinnorhea. Mouth/Throat: Mucous membranes are moist.  Neck: No stridor.   Cardiovascular: Normal rate, regular rhythm. Grossly normal heart sounds.   Respiratory: Normal respiratory effort.  No retractions. Lungs CTAB. Gastrointestinal: Soft and nontender. No distention. No CVA tenderness.  Musculoskeletal: No lower extremity tenderness nor edema.  No joint effusions. Neurologic:  Normal speech and language. No gross focal neurologic deficits are appreciated. Skin:  Skin is warm, dry and intact. No rash noted. Psychiatric: Mood and affect are normal. Speech and behavior are normal.  ____________________________________________   LABS (all labs ordered are listed, but only abnormal results are displayed)  Labs Reviewed  CBC WITH DIFFERENTIAL/PLATELET - Abnormal; Notable for the following components:      Result Value   Abs Immature Granulocytes 0.24 (*)    All other components within normal limits  BASIC METABOLIC PANEL - Abnormal; Notable for the following  components:   Glucose, Bld 154 (*)    Calcium 8.7 (*)    All other components within normal limits  URINALYSIS, COMPLETE (UACMP) WITH MICROSCOPIC - Abnormal; Notable for the following components:   Color, Urine YELLOW (*)    APPearance CLEAR (*)    Hgb urine dipstick MODERATE (*)    Ketones, ur 5 (*)    All other components within normal limits  TROPONIN I   ____________________________________________  EKG   ____________________________________________  RADIOLOGY  3 mm stone within the left distal ureter.  Mild left hydroureteronephrosis. ____________________________________________   PROCEDURES  Procedure(s) performed:   Procedures  Critical Care performed:   ____________________________________________   INITIAL IMPRESSION / ASSESSMENT AND PLAN / ED COURSE  Pertinent labs & imaging results that were available during my care of the patient were reviewed by me and considered in my medical decision making (see chart for details).  Differential diagnosis includes, but is not limited to, acute appendicitis, renal colic, testicular torsion, urinary tract infection/pyelonephritis, prostatitis,  epididymitis, diverticulitis, small bowel obstruction or ileus, colitis, abdominal aortic aneurysm, gastroenteritis, hernia, etc. As part of my medical decision making, I reviewed the following data within the electronic MEDICAL RECORD NUMBER Notes from prior ED visits  ----------------------------------------- 10:22 AM on 12/11/2017 -----------------------------------------  Patient initially pain-free after Toradol but says pain is starting to come back.  3 mm left distal stone.  Patient will be given Percocet at this time and discharged with Percocet as well as Flomax.  Patient understands the diagnosis as well as the need for return to the emergency department for any worsening or concerning symptoms especially worsening abdominal pain, fever or chills.  Family also concerned about  patient's persistent cough.  No cough although patient is lying down.  Re-auscultated his lungs and he is clear throughout all fields.  Patient attempting out of follow-up with primary care.  He says that he has been through 2 rounds of antibiotics as well as steroids without resolution of the symptoms.  Reassuring vital signs including oxygen saturation.  No distress.  I believe this may be taken care of as an outpatient.  Family also says that they have the contact information for ear nose and throat. ____________________________________________   FINAL CLINICAL IMPRESSION(S) / ED DIAGNOSES  Kidney stone  NEW MEDICATIONS STARTED DURING THIS VISIT:  New Prescriptions   No medications on file     Note:  This document was prepared using Dragon voice recognition software and may include unintentional dictation errors.     Myrna Blazer, MD 12/11/17 1023

## 2017-12-11 NOTE — ED Triage Notes (Signed)
Pt arrives via [facility] from home c/o back pain, concerned of kidney stone. EDP bedside.

## 2018-11-24 ENCOUNTER — Other Ambulatory Visit: Payer: Self-pay

## 2018-11-24 DIAGNOSIS — Z20822 Contact with and (suspected) exposure to covid-19: Secondary | ICD-10-CM

## 2018-11-25 LAB — SPECIMEN STATUS REPORT

## 2018-11-27 ENCOUNTER — Telehealth: Payer: Self-pay | Admitting: General Practice

## 2018-11-27 NOTE — Telephone Encounter (Signed)
Pt wife called in to ask about this pt covid test that was done on 10/23.  It states that it was not collected.  Please advise as to what is going the swab  Best number  [phone]

## 2018-11-27 NOTE — Telephone Encounter (Signed)
Attempted to contact [facility] to obtain more information as to why the COVID testing was cancelled. No answer after extended hold time.  Pt's wife called and notified that the patient would need to go for retesting. Understanding verbalized.

## 2018-11-28 LAB — NOVEL CORONAVIRUS, NAA

## 2018-11-28 NOTE — Telephone Encounter (Signed)
[employer] [facility] [facility] returned call- Autumn  The test and specimen received did not match- [facility] reports they received a serun gel blood specimen with nasal swab order.  In looking at test- it was preformed at drive through and they do not draw blood at [facility].- patient has been contacted to retest and [place] is going to cancel the previous test and discard sample. Cristal Generous.RN made aware.

## 2020-05-07 ENCOUNTER — Ambulatory Visit: Payer: Medicare Other | Admitting: Dermatology

## 2020-06-10 ENCOUNTER — Other Ambulatory Visit: Payer: Self-pay

## 2020-06-10 ENCOUNTER — Ambulatory Visit (INDEPENDENT_AMBULATORY_CARE_PROVIDER_SITE_OTHER): Payer: Medicare Other

## 2020-06-10 ENCOUNTER — Ambulatory Visit
Admission: EM | Admit: 2020-06-10 | Discharge: 2020-06-10 | Disposition: A | Discharge status: Home / Self Care | Payer: Medicare Other | Attending: Family Medicine | Admitting: Family Medicine

## 2020-06-10 DIAGNOSIS — J208 Acute bronchitis due to other specified organisms: Secondary | ICD-10-CM

## 2020-06-10 DIAGNOSIS — U071 COVID-19: Secondary | ICD-10-CM | POA: Diagnosis not present

## 2020-06-10 DIAGNOSIS — R059 Cough, unspecified: Secondary | ICD-10-CM | POA: Diagnosis not present

## 2020-06-10 DIAGNOSIS — R509 Fever, unspecified: Secondary | ICD-10-CM

## 2020-06-10 MED ORDER — PREDNISONE 50 MG PO TABS: ORAL_TABLET | ORAL | 0 refills | Status: DC

## 2020-06-10 MED ORDER — BENZONATATE 200 MG PO CAPS: 200.0000 mg | ORAL_CAPSULE | Freq: Three times a day (TID) | ORAL | 0 refills | Status: DC | PRN

## 2020-06-10 NOTE — ED Triage Notes (Signed)
Pt c/o cough and fever since Saturday. States he has flem and chest congestion.

## 2020-06-10 NOTE — ED Provider Notes (Signed)
[facility]    CSN: [id number] Arrival date & time: 06/10/20  1141  History   Chief Complaint Chief Complaint  Patient presents with  . Cough   HPI  70 year old male presents with cough.  Patient reports he has been sick since Saturday.  He reports cough.  He reports subjective fever.  No documented fever.  He states that his cough is productive.  Denies shortness of breath.  No relieving factors.  No other complaints or concerns at this time.  Past Medical History:  Diagnosis Date  . Kidney stones    Past Surgical History:  Procedure Laterality Date  . HERNIA REPAIR     Home Medications    Prior to Admission medications   Medication Sig Start Date End Date Taking? Authorizing Provider  benzonatate (TESSALON) 200 MG capsule Take 1 capsule (200 mg total) by mouth 3 (three) times daily as needed for cough. 06/10/20  Yes Lanayah Gartley G, DO  predniSONE (DELTASONE) 50 MG tablet 1 tablet daily x 5 days 06/10/20  Yes Dariyah Garduno G, DO  ipratropium (ATROVENT) 0.06 % nasal spray Place 2 sprays into both nostrils 4 (four) times daily as needed for rhinitis. 12/01/17 06/10/20  Tommie Sams, DO    Family History No family history on file.  Social History Social History   Tobacco Use  . Smoking status: Never Smoker  . Smokeless tobacco: Never Used  Vaping Use  . Vaping Use: Never used  Substance Use Topics  . Alcohol use: Yes    Comment: socially  . Drug use: No     Allergies   Patient has no known allergies.   Review of Systems Review of Systems  Constitutional: Positive for fever.  Respiratory: Positive for cough. Negative for shortness of breath.    Physical Exam Triage Vital Signs ED Triage Vitals  Enc Vitals Group     BP 06/10/20 1253 127/89     Pulse Rate 06/10/20 1253 63     Resp 06/10/20 1253 16     Temp 06/10/20 1253 98.8 F (37.1 C)     Temp Source 06/10/20 1253 Oral     SpO2 06/10/20 1253 96 %     Weight 06/10/20 1253 225 lb 1.4 oz  (102.1 kg)     Height 06/10/20 1253 6' (1.829 m)     Head Circumference --      Peak Flow --      Pain Score 06/10/20 1252 0     Pain Loc --      Pain Edu? --      Excl. in GC? --    Updated Vital Signs BP 127/89 (BP Location: Right Arm)   Pulse 63   Temp 98.8 F (37.1 C) (Oral)   Resp 16   Ht 6' (1.829 m)   Wt 102.1 kg   SpO2 96%   BMI 30.53 kg/m   Visual Acuity Right Eye Distance:   Left Eye Distance:   Bilateral Distance:    Right Eye Near:   Left Eye Near:    Bilateral Near:     Physical Exam Vitals and nursing note reviewed.  Constitutional:      General: He is not in acute distress.    Appearance: Normal appearance. He is not ill-appearing.  HENT:     Head: Normocephalic and atraumatic.  Eyes:     General:        Right eye: No discharge.        Left eye:  No discharge.     Conjunctiva/sclera: Conjunctivae normal.  Cardiovascular:     Rate and Rhythm: Normal rate and regular rhythm.  Pulmonary:     Effort: Pulmonary effort is normal.     Breath sounds: Normal breath sounds. No wheezing, rhonchi or rales.  Neurological:     Mental Status: He is alert.  Psychiatric:        Mood and Affect: Mood normal.        Behavior: Behavior normal.    UC Treatments / Results  Labs (all labs ordered are listed, but only abnormal results are displayed) Labs Reviewed  SARS CORONAVIRUS 2 (TAT 6-24 HRS)    EKG   Radiology DG Chest 2 View  Result Date: 06/10/2020 CLINICAL DATA:  Cough and fever beginning 06/07/2020. EXAM: CHEST - 2 VIEW COMPARISON:  None. FINDINGS: There is mild linear atelectasis or scar in the lung bases bilaterally. No consolidative process, pneumothorax or effusion. Heart size is normal. No acute or focal bony abnormality. IMPRESSION: No acute disease. Electronically Signed   By: Drusilla Kanner M.D.   On: 06/10/2020 13:54    Procedures Procedures (including critical care time)  Medications Ordered in UC Medications - No data to  display  Initial Impression / Assessment and Plan / UC Course  I have reviewed the triage vital signs and the nursing notes.  Pertinent labs & imaging results that were available during my care of the patient were reviewed by me and considered in my medical decision making (see chart for details).    70 year old male presents with acute bronchitis.  This is likely viral in origin.  Chest x-ray was obtained given his symptomatology, age, and reported fever.  Chest x-ray was independently reviewed by me.  Interpretation: Normal chest x-ray.  No evidence of pneumonia.  Treating for viral bronchitis with Tessalon Perles and Prednisone.   Final Clinical Impressions(s) / UC Diagnoses   Final diagnoses:  Viral bronchitis     Discharge Instructions     Medication as prescribed.  Take care  Dr. Adriana Simas   ED Prescriptions    Medication Sig Dispense Auth. Provider   predniSONE (DELTASONE) 50 MG tablet 1 tablet daily x 5 days 5 tablet Emmery Seiler G, DO   benzonatate (TESSALON) 200 MG capsule Take 1 capsule (200 mg total) by mouth 3 (three) times daily as needed for cough. 30 capsule Tommie Sams, DO     PDMP not reviewed this encounter.   Tommie Sams, [place] 06/10/20 1502

## 2020-06-10 NOTE — Discharge Instructions (Signed)
Medication as prescribed.  Take care  Dr. Laqueisha Catalina  

## 2020-06-11 LAB — SARS CORONAVIRUS 2 (TAT 6-24 HRS): SARS Coronavirus 2: POSITIVE — AB

## 2022-02-07 ENCOUNTER — Encounter: Payer: Self-pay | Admitting: Emergency Medicine

## 2022-02-07 ENCOUNTER — Ambulatory Visit
Admission: EM | Admit: 2022-02-07 | Discharge: 2022-02-07 | Disposition: A | Discharge status: Home / Self Care | Payer: Medicare Other | Attending: Physician Assistant | Admitting: Physician Assistant

## 2022-02-07 DIAGNOSIS — U071 COVID-19: Secondary | ICD-10-CM | POA: Insufficient documentation

## 2022-02-07 DIAGNOSIS — Z20822 Contact with and (suspected) exposure to covid-19: Secondary | ICD-10-CM | POA: Insufficient documentation

## 2022-02-07 DIAGNOSIS — R051 Acute cough: Secondary | ICD-10-CM | POA: Insufficient documentation

## 2022-02-07 LAB — RESP PANEL BY RT-PCR (RSV, FLU A&B, COVID)  RVPGX2
Influenza A by PCR: NEGATIVE
Influenza B by PCR: NEGATIVE
Resp Syncytial Virus by PCR: NEGATIVE
SARS Coronavirus 2 by RT PCR: POSITIVE — AB

## 2022-02-07 MED ORDER — MOLNUPIRAVIR 200 MG PO CAPS: 4.0000 | ORAL_CAPSULE | Freq: Two times a day (BID) | ORAL | 0 refills | Status: AC

## 2022-02-07 MED ORDER — HYDROCOD POLI-CHLORPHE POLI ER 10-8 MG/5ML PO SUER: 5.0000 mL | Freq: Two times a day (BID) | ORAL | 0 refills | Status: DC | PRN

## 2022-02-07 NOTE — Discharge Instructions (Signed)
-  Your COVID test is positive.  Isolate 5 days and wear mask x 5 days.  Today is day 1. -Increase rest and fluids.  I have sent cough medication and antiviral medication to the pharmacy. - Syou should be feeling better over the next week or 2. - If you have any uncontrolled fever, weakness or breathing difficulty, go to ER.

## 2022-02-07 NOTE — ED Provider Notes (Signed)
[facility]    CSN: [id number] Arrival date & time: 02/07/22  0947      History   Chief Complaint Chief Complaint  Patient presents with   Generalized Body Aches   Cough    HPI Ricky Li is a 72 y.o. male presenting for onset of fatigue, fever, congestion, cough and bodyaches that began yesterday.  He has been exposed to [place] through his daughter.  He is also been in and out of the hospital with a family member.  Patient has not tested for COVID at home.  He reports that he is up-to-date with his vaccinations for COVID and flu as of September.  He denies any weakness or shortness of breath, vomiting or diarrhea.  He has been taking over-the-counter cough medicine and Tylenol for symptoms.  No other complaints.  HPI  Past Medical History:  Diagnosis Date   Kidney stones     There are no problems to display for this patient.   Past Surgical History:  Procedure Laterality Date   HERNIA REPAIR         Home Medications    Prior to Admission medications   Medication Sig Start Date End Date Taking? Authorizing Provider  chlorpheniramine-HYDROcodone (TUSSIONEX) 10-8 MG/5ML Take 5 mLs by mouth every 12 (twelve) hours as needed for cough. 02/07/22  Yes Danton Clap, PA-C  molnupiravir EUA (LAGEVRIO) 200 MG CAPS capsule Take 4 capsules (800 mg total) by mouth 2 (two) times daily for 5 days. 02/07/22 02/12/22 Yes Laurene Footman B, PA-C  ipratropium (ATROVENT) 0.06 % nasal spray Place 2 sprays into both nostrils 4 (four) times daily as needed for rhinitis. 12/01/17 06/10/20  Coral Spikes, DO    Family History History reviewed. No pertinent family history.  Social History Social History   Tobacco Use   Smoking status: Never   Smokeless tobacco: Never  Vaping Use   Vaping Use: Never used  Substance Use Topics   Alcohol use: Yes    Comment: socially   Drug use: No     Allergies   Patient has no known allergies.   Review of Systems Review of Systems   Constitutional:  Positive for fatigue and fever.  HENT:  Positive for congestion and rhinorrhea. Negative for sinus pressure, sinus pain and sore throat.   Respiratory:  Positive for cough. Negative for shortness of breath.   Gastrointestinal:  Negative for abdominal pain, diarrhea, nausea and vomiting.  Musculoskeletal:  Positive for myalgias.  Neurological:  Negative for weakness, light-headedness and headaches.  Hematological:  Negative for adenopathy.     Physical Exam Triage Vital Signs ED Triage Vitals  Enc Vitals Group     BP 02/07/22 1002 121/83     Pulse Rate 02/07/22 1002 74     Resp 02/07/22 1002 15     Temp 02/07/22 1002 98.8 F (37.1 C)     Temp Source 02/07/22 1002 Oral     SpO2 02/07/22 1002 97 %     Weight 02/07/22 1001 240 lb (108.9 kg)     Height 02/07/22 1001 6' (1.829 m)     Head Circumference --      Peak Flow --      Pain Score 02/07/22 1001 5     Pain Loc --      Pain Edu? --      Excl. in [place]? --    No data found.  Updated Vital Signs BP 121/83 (BP Location: Right Arm)  Pulse 74   Temp 98.8 F (37.1 C) (Oral)   Resp 15   Ht 6' (1.829 m)   Wt 240 lb (108.9 kg)   SpO2 97%   BMI 32.55 kg/m     Physical Exam Vitals and nursing note reviewed.  Constitutional:      General: He is not in acute distress.    Appearance: Normal appearance. He is well-developed. He is not ill-appearing.  HENT:     Head: Normocephalic and atraumatic.     Nose: Congestion present.     Mouth/Throat:     Mouth: Mucous membranes are moist.     Pharynx: Oropharynx is clear. Posterior oropharyngeal erythema present.  Eyes:     General: No scleral icterus.    Conjunctiva/sclera: Conjunctivae normal.  Cardiovascular:     Rate and Rhythm: Normal rate and regular rhythm.     Heart sounds: Normal heart sounds.  Pulmonary:     Effort: Pulmonary effort is normal. No respiratory distress.     Breath sounds: Normal breath sounds.  Musculoskeletal:     Cervical back:  Neck supple.  Skin:    General: Skin is warm and dry.     Capillary Refill: Capillary refill takes less than 2 seconds.  Neurological:     General: No focal deficit present.     Mental Status: He is alert. Mental status is at baseline.     Motor: No weakness.     Gait: Gait normal.  Psychiatric:        Mood and Affect: Mood normal.        Behavior: Behavior normal.      UC Treatments / Results  Labs (all labs ordered are listed, but only abnormal results are displayed) Labs Reviewed  RESP PANEL BY RT-PCR (RSV, FLU A&B, COVID)  RVPGX2 - Abnormal; Notable for the following components:      Result Value   SARS Coronavirus 2 by RT PCR POSITIVE (*)    All other components within normal limits    EKG   Radiology No results found.  Procedures Procedures (including critical care time)  Medications Ordered in UC Medications - No data to display  Initial Impression / Assessment and Plan / UC Course  I have reviewed the triage vital signs and the nursing notes.  Pertinent labs & imaging results that were available during my care of the patient were reviewed by me and considered in my medical decision making (see chart for details).   72 year old male presents for onset of fever, fatigue, cough, congestion and bodyaches that began yesterday.  Has been exposed to COVID-19.  Vitals normal and stable.  Patient overall well-appearing.  On exam he has nasal congestion and erythema posterior pharynx.  Chest clear auscultation heart regular rate and rhythm.  Respiratory panel obtained.  Positive COVID.  Discussed result with patient.  Reviewed current CDC guidelines, isolation protocol and ED precautions.  Sent molnupiravir.  Patient requests Tussionex so that he can sleep.  He says he had it before.  I explained that he should use it sparingly.  Sent cough medication to pharmacy.  Reviewed plenty of rest and fluids.  Follow-up as needed.   Final Clinical Impressions(s) / UC Diagnoses    Final diagnoses:  COVID-19  Acute cough  Exposure to COVID-19 virus     Discharge Instructions      -Your COVID test is positive.  Isolate 5 days and wear mask x 5 days.  Today is day 1. -Increase rest  and fluids.  I have sent cough medication and antiviral medication to the pharmacy. - Syou should be feeling better over the next week or 2. - If you have any uncontrolled fever, weakness or breathing difficulty, go to ER.     ED Prescriptions     Medication Sig Dispense Auth. Provider   molnupiravir EUA (LAGEVRIO) 200 MG CAPS capsule Take 4 capsules (800 mg total) by mouth 2 (two) times daily for 5 days. 40 capsule Eusebio Friendly B, PA-C   chlorpheniramine-HYDROcodone (TUSSIONEX) 10-8 MG/5ML Take 5 mLs by mouth every 12 (twelve) hours as needed for cough. 70 mL Shirlee Latch, PA-C      I have reviewed the PDMP during this encounter.   Shirlee Latch, PA-C 02/07/22 1055

## 2022-02-07 NOTE — ED Triage Notes (Signed)
Patient c/o cough, chest congestion, nasal congestion and bodyaches that started yesterday.  Patient unsure of fevers.

## 2022-03-24 ENCOUNTER — Encounter: Payer: Self-pay | Admitting: Emergency Medicine

## 2022-03-24 ENCOUNTER — Ambulatory Visit
Admission: EM | Admit: 2022-03-24 | Discharge: 2022-03-24 | Disposition: A | Discharge status: Home / Self Care | Payer: Medicare Other | Attending: Physician Assistant | Admitting: Physician Assistant

## 2022-03-24 ENCOUNTER — Ambulatory Visit (INDEPENDENT_AMBULATORY_CARE_PROVIDER_SITE_OTHER): Payer: Medicare Other

## 2022-03-24 DIAGNOSIS — R058 Other specified cough: Secondary | ICD-10-CM | POA: Insufficient documentation

## 2022-03-24 DIAGNOSIS — B349 Viral infection, unspecified: Secondary | ICD-10-CM | POA: Insufficient documentation

## 2022-03-24 DIAGNOSIS — R5383 Other fatigue: Secondary | ICD-10-CM | POA: Insufficient documentation

## 2022-03-24 DIAGNOSIS — Z8616 Personal history of COVID-19: Secondary | ICD-10-CM | POA: Insufficient documentation

## 2022-03-24 LAB — RESP PANEL BY RT-PCR (RSV, FLU A&B, COVID)  RVPGX2
Influenza A by PCR: NEGATIVE
Influenza B by PCR: NEGATIVE
Resp Syncytial Virus by PCR: NEGATIVE
SARS Coronavirus 2 by RT PCR: NEGATIVE

## 2022-03-24 MED ORDER — HYDROCOD POLI-CHLORPHE POLI ER 10-8 MG/5ML PO SUER: 5.0000 mL | Freq: Two times a day (BID) | ORAL | 0 refills | Status: DC | PRN

## 2022-03-24 NOTE — Discharge Instructions (Signed)
URI/COLD SYMPTOMS: Your exam today is consistent with a viral illness. Antibiotics are not indicated at this time. Use medications as directed, including cough syrup, nasal saline, and decongestants. Your symptoms should improve over the next few days and resolve within 7-10 days. Increase rest and fluids. F/u if symptoms worsen or predominate such as sore throat, ear pain, productive cough, shortness of breath, or if you develop high fevers or worsening fatigue over the next several days.    

## 2022-03-24 NOTE — ED Provider Notes (Signed)
[facility]    CSN: [id number] Arrival date & time: 03/24/22  [id number]      History   Chief Complaint Chief Complaint  Patient presents with   Cough   Nasal Congestion   Headache    HPI Ricky Li is a 72 y.o. male presenting for onset of fatigue, chills, congestion, cough and bodyaches that began 3 days ago.  He says his cough is productive of brownish sputum and it is very thick.  He denies any weakness or shortness of breath, vomiting or diarrhea.  He has been taking over-the-counter cough medicine and Tylenol for symptoms. History of COVID 19 last month.  No other complaints.  HPI  Past Medical History:  Diagnosis Date   Kidney stones     There are no problems to display for this patient.   Past Surgical History:  Procedure Laterality Date   HERNIA REPAIR         Home Medications    Prior to Admission medications   Medication Sig Start Date End Date Taking? Authorizing Provider  chlorpheniramine-HYDROcodone (TUSSIONEX) 10-8 MG/5ML Take 5 mLs by mouth every 12 (twelve) hours as needed for cough. 03/24/22   Laurene Footman B, PA-C  ipratropium (ATROVENT) 0.06 % nasal spray Place 2 sprays into both nostrils 4 (four) times daily as needed for rhinitis. 12/01/17 06/10/20  Coral Spikes, DO    Family History History reviewed. No pertinent family history.  Social History Social History   Tobacco Use   Smoking status: Never   Smokeless tobacco: Never  Vaping Use   Vaping Use: Never used  Substance Use Topics   Alcohol use: Yes    Comment: socially   Drug use: No     Allergies   Patient has no known allergies.   Review of Systems Review of Systems  Constitutional:  Positive for fatigue. Negative for fever.  HENT:  Positive for congestion and rhinorrhea. Negative for sinus pressure, sinus pain and sore throat.   Respiratory:  Positive for cough. Negative for shortness of breath.   Gastrointestinal:  Negative for abdominal pain, diarrhea,  nausea and vomiting.  Musculoskeletal:  Positive for myalgias.  Neurological:  Negative for weakness, light-headedness and headaches.  Hematological:  Negative for adenopathy.     Physical Exam Triage Vital Signs ED Triage Vitals  Enc Vitals Group     BP 02/07/22 1002 121/83     Pulse Rate 02/07/22 1002 74     Resp 02/07/22 1002 15     Temp 02/07/22 1002 98.8 F (37.1 C)     Temp Source 02/07/22 1002 Oral     SpO2 02/07/22 1002 97 %     Weight 02/07/22 1001 240 lb (108.9 kg)     Height 02/07/22 1001 6' (1.829 m)     Head Circumference --      Peak Flow --      Pain Score 02/07/22 1001 5     Pain Loc --      Pain Edu? --      Excl. in [place]? --    No data found.  Updated Vital Signs BP 125/83 (BP Location: Left Arm)   Pulse 70   Temp 97.9 F (36.6 C) (Oral)   Resp 16   SpO2 94%     Physical Exam Vitals and nursing note reviewed.  Constitutional:      General: He is not in acute distress.    Appearance: Normal appearance. He is well-developed. He is not  ill-appearing.  HENT:     Head: Normocephalic and atraumatic.     Nose: Congestion present.     Mouth/Throat:     Mouth: Mucous membranes are moist.     Pharynx: Oropharynx is clear. No posterior oropharyngeal erythema.  Eyes:     General: No scleral icterus.    Conjunctiva/sclera: Conjunctivae normal.  Cardiovascular:     Rate and Rhythm: Normal rate and regular rhythm.     Heart sounds: Normal heart sounds.  Pulmonary:     Effort: Pulmonary effort is normal. No respiratory distress.     Breath sounds: Normal breath sounds. No wheezing, rhonchi or rales.  Musculoskeletal:     Cervical back: Neck supple.  Skin:    General: Skin is warm and dry.     Capillary Refill: Capillary refill takes less than 2 seconds.  Neurological:     General: No focal deficit present.     Mental Status: He is alert. Mental status is at baseline.     Motor: No weakness.     Gait: Gait normal.  Psychiatric:        Mood and  Affect: Mood normal.        Behavior: Behavior normal.      UC Treatments / Results  Labs (all labs ordered are listed, but only abnormal results are displayed) Labs Reviewed  RESP PANEL BY RT-PCR (RSV, FLU A&B, COVID)  RVPGX2    EKG   Radiology DG Chest 2 View  Result Date: 03/24/2022 CLINICAL DATA:  Cough with brown sputum for few days, congestion, and headache, had COVID-19 last month EXAM: CHEST - 2 VIEW COMPARISON:  06/10/2020 FINDINGS: Normal heart size and pulmonary vascularity. Tortuous thoracic aorta. Bibasilar linear scarring unchanged. No acute infiltrate, pleural effusion, or pneumothorax. Osseous structures unremarkable. IMPRESSION: Linear bibasilar scarring. No acute abnormalities. Electronically Signed   By: Lavonia Dana M.D.   On: 03/24/2022 12:09    Procedures Procedures (including critical care time)  Medications Ordered in UC Medications - No data to display  Initial Impression / Assessment and Plan / UC Course  I have reviewed the triage vital signs and the nursing notes.  Pertinent labs & imaging results that were available during my care of the patient were reviewed by me and considered in my medical decision making (see chart for details).   72 year old male presents for onset of fatigue, cough, congestion and bodyaches that began 3 days ago.  Has history of COVID-19 last month.  Vitals normal and stable.  Patient overall well-appearing.  On exam he has nasal congestion. Chest clear auscultation heart regular rate and rhythm.  Respiratory panel negative.  Discussed result with patient.  Chest x-ray obtained given his history of COVID-19 last month and his complaint of brownish sputum over the past few days.  X-ray shows no acute abnormality.  Likely viral illness.  Sent cough medication Tussionex to pharmacy.  Reviewed plenty of rest and fluids.  Follow-up as needed.  Reviewed ED precautions.   Final Clinical Impressions(s) / UC Diagnoses   Final  diagnoses:  Viral illness  Productive cough  Other fatigue  History of COVID-19     Discharge Instructions      URI/COLD SYMPTOMS: Your exam today is consistent with a viral illness. Antibiotics are not indicated at this time. Use medications as directed, including cough syrup, nasal saline, and decongestants. Your symptoms should improve over the next few days and resolve within 7-10 days. Increase rest and fluids. F/u if symptoms  worsen or predominate such as sore throat, ear pain, productive cough, shortness of breath, or if you develop high fevers or worsening fatigue over the next several days.         ED Prescriptions     Medication Sig Dispense Auth. Provider   chlorpheniramine-HYDROcodone (TUSSIONEX) 10-8 MG/5ML Take 5 mLs by mouth every 12 (twelve) hours as needed for cough. 70 mL Danton Clap, PA-C      PDMP not reviewed this encounter.     Danton Clap, PA-C 03/24/22 1216

## 2022-03-24 NOTE — ED Triage Notes (Signed)
Pt presents with a productive cough, congestion and headache x 3 days.

## 2022-12-03 ENCOUNTER — Ambulatory Visit
Admission: EM | Admit: 2022-12-03 | Discharge: 2022-12-03 | Disposition: A | Discharge status: Home / Self Care | Payer: Medicare Other | Attending: Family Medicine | Admitting: Family Medicine

## 2022-12-03 ENCOUNTER — Ambulatory Visit (INDEPENDENT_AMBULATORY_CARE_PROVIDER_SITE_OTHER): Payer: Medicare Other

## 2022-12-03 ENCOUNTER — Encounter: Payer: Self-pay | Admitting: Emergency Medicine

## 2022-12-03 DIAGNOSIS — J069 Acute upper respiratory infection, unspecified: Secondary | ICD-10-CM

## 2022-12-03 DIAGNOSIS — Z1152 Encounter for screening for COVID-19: Secondary | ICD-10-CM | POA: Diagnosis not present

## 2022-12-03 DIAGNOSIS — M47814 Spondylosis without myelopathy or radiculopathy, thoracic region: Secondary | ICD-10-CM | POA: Insufficient documentation

## 2022-12-03 DIAGNOSIS — B9789 Other viral agents as the cause of diseases classified elsewhere: Secondary | ICD-10-CM | POA: Diagnosis not present

## 2022-12-03 DIAGNOSIS — R059 Cough, unspecified: Secondary | ICD-10-CM | POA: Diagnosis present

## 2022-12-03 LAB — RESP PANEL BY RT-PCR (RSV, FLU A&B, COVID)  RVPGX2
Influenza A by PCR: NEGATIVE
Influenza B by PCR: NEGATIVE
Resp Syncytial Virus by PCR: NEGATIVE
SARS Coronavirus 2 by RT PCR: NEGATIVE

## 2022-12-03 MED ORDER — HYDROCOD POLI-CHLORPHE POLI ER 10-8 MG/5ML PO SUER: 5.0000 mL | Freq: Two times a day (BID) | ORAL | 0 refills | Status: AC | PRN

## 2022-12-03 MED ORDER — BENZONATATE 100 MG PO CAPS: 100.0000 mg | ORAL_CAPSULE | Freq: Three times a day (TID) | ORAL | 0 refills | Status: DC

## 2022-12-03 NOTE — Discharge Instructions (Addendum)
Your COVID, influenza A/B and RSV (respiratory syncytial virus) testing was all negative.    Your chest x-ray did not show evidence of pneumonia or bronchitis but you do have some scarring there in your lungs which was present back in February as well.  The radiologist has not yet formally read your x-ray however and if they find something that I do not and you need to start antibiotics, I will call you.    Stop by the pharmacy to pick up your cough medications.

## 2022-12-03 NOTE — ED Triage Notes (Signed)
Patient c/o cough, chest congestion, fatigue, and headache that started earlier this week. Patient unsure of fevers.

## 2022-12-03 NOTE — ED Provider Notes (Signed)
[facility]    CSN: [id number] Arrival date & time: 12/03/22  0805      History   Chief Complaint Chief Complaint  Patient presents with   Cough    HPI Ricky Li is a 72 y.o. male.   HPI  History obtained from the patient. Ricky Li presents for cough that acutely got worse last night. Endorses decreased appetite, mild headache and fatigue.  No fever, sore throat, vomiting or diarrhea. No known sick contacts. Took some Mucinex but that didn't help. Had visitation at a funeral home in [place] for his nephew.        Past Medical History:  Diagnosis Date   Kidney stones     There are no problems to display for this patient.   Past Surgical History:  Procedure Laterality Date   HERNIA REPAIR         Home Medications    Prior to Admission medications   Medication Sig Start Date End Date Taking? Authorizing Provider  benzonatate (TESSALON) 100 MG capsule Take 1 capsule (100 mg total) by mouth every 8 (eight) hours. 12/03/22  Yes Aayliah Rotenberry, DO  chlorpheniramine-HYDROcodone (TUSSIONEX) 10-8 MG/5ML Take 5 mLs by mouth every 12 (twelve) hours as needed for cough. 12/03/22   Clotee Schlicker, Seward Meth, DO  ipratropium (ATROVENT) 0.06 % nasal spray Place 2 sprays into both nostrils 4 (four) times daily as needed for rhinitis. 12/01/17 06/10/20  Tommie Sams, DO    Family History History reviewed. No pertinent family history.  Social History Social History   Tobacco Use   Smoking status: Never   Smokeless tobacco: Never  Vaping Use   Vaping status: Never Used  Substance Use Topics   Alcohol use: Yes    Comment: socially   Drug use: No     Allergies   Patient has no known allergies.   Review of Systems Review of Systems: negative unless otherwise stated in HPI.      Physical Exam Triage Vital Signs ED Triage Vitals  Encounter Vitals Group     BP 12/03/22 0840 (!) 133/90     Systolic BP Percentile --      Diastolic BP Percentile --       Pulse Rate 12/03/22 0840 62     Resp 12/03/22 0840 15     Temp 12/03/22 0840 98.2 F (36.8 C)     Temp Source 12/03/22 0840 Oral     SpO2 12/03/22 0840 99 %     Weight 12/03/22 0839 240 lb 1.3 oz (108.9 kg)     Height 12/03/22 0839 6' (1.829 m)     Head Circumference --      Peak Flow --      Pain Score 12/03/22 0838 2     Pain Loc --      Pain Education --      Exclude from Growth Chart --    No data found.  Updated Vital Signs BP (!) 133/90 (BP Location: Left Arm)   Pulse 62   Temp 98.2 F (36.8 C) (Oral)   Resp 15   Ht 6' (1.829 m)   Wt 108.9 kg   SpO2 99%   BMI 32.56 kg/m   Visual Acuity Right Eye Distance:   Left Eye Distance:   Bilateral Distance:    Right Eye Near:   Left Eye Near:    Bilateral Near:     Physical Exam GEN:     alert, non-toxic appearing male in no distress  HENT:  mucus membranes moist, no nasal discharge, hard of hearing  EYES:   no scleral injection or discharge RESP:  no increased work of breathing, coarse breath sounds bilaterally, no wheezing CVS:   regular rate and rhythm Skin:   warm and dry, no rash on visible skin    UC Treatments / Results  Labs (all labs ordered are listed, but only abnormal results are displayed) Labs Reviewed  RESP PANEL BY RT-PCR (RSV, FLU A&B, COVID)  RVPGX2    EKG   Radiology DG Chest 2 View  Result Date: 12/03/2022 CLINICAL DATA:  Cough for 1 week. EXAM: CHEST - 2 VIEW COMPARISON:  Chest radiographs 03/24/2022 and 06/10/2020 FINDINGS: Cardiac silhouette and mediastinal contours are within limits. No significant change in horizontal linear scarring within the bilateral lung bases, unchanged on multiple prior comparisons. No new acute airspace opacity. No pleural effusion pneumothorax. Mild multilevel degenerative disc changes of the thoracic spine. IMPRESSION: 1. No acute cardiopulmonary process. 2. Unchanged horizontal linear scarring within the bilateral lung bases. Electronically Signed   By:  Neita Garnet M.D.   On: 12/03/2022 11:04    Procedures Procedures (including critical care time)  Medications Ordered in UC Medications - No data to display  Initial Impression / Assessment and Plan / UC Course  I have reviewed the triage vital signs and the nursing notes.  Pertinent labs & imaging results that were available during my care of the patient were reviewed by me and considered in my medical decision making (see chart for details).       Pt is a 72 y.o. male who presents for 4 days of respiratory symptoms. [place] is afebrile here without recent antipyretics. Satting well on room air. Overall pt is non-toxic appearing, well hydrated, without respiratory distress. Pulmonary exam is remarkable coarse sounds bilaterally.  COVID, influenza A/B and RSV panel obtained and was negative.  Chest xray personally reviewed by me without focal pneumonia, pleural effusion, cardiomegaly or pneumothorax.  Has some linear scarring that was present on his March 24, 2022 for chest x-ray. Patient aware the radiologist has not read his xray and is comfortable with the preliminary read by me. Will review radiologist read when available and call patient if a change in plan is warranted.  Pt agreeable to this plan prior to discharge.   History consistent with viral respiratory illness. Discussed symptomatic treatment.  Explained lack of efficacy of antibiotics in viral disease.  Typical duration of symptoms discussed.  Tessalon Perles prescribed for cough.   Return and ED precautions given and voiced understanding. Discussed MDM, treatment plan and plan for follow-up with patient who agrees with plan.   Radiologist impression reviewed   Final Clinical Impressions(s) / UC Diagnoses   Final diagnoses:  Viral URI with cough     Discharge Instructions      Your COVID, influenza A/B and RSV (respiratory syncytial virus) testing was all negative.    Your chest x-ray did not show evidence of  pneumonia or bronchitis but you do have some scarring there in your lungs which was present back in February as well.  The radiologist has not yet formally read your x-ray however and if they find something that I do not and you need to start antibiotics, I will call you.    Stop by the pharmacy to pick up your cough medications.     ED Prescriptions     Medication Sig Dispense Auth. Provider   chlorpheniramine-HYDROcodone (TUSSIONEX) 10-8 MG/5ML Take  5 mLs by mouth every 12 (twelve) hours as needed for cough. 70 mL Jamair Cato, DO   benzonatate (TESSALON) 100 MG capsule Take 1 capsule (100 mg total) by mouth every 8 (eight) hours. 21 capsule Tanita Palinkas, DO      I have reviewed the PDMP during this encounter.   Katha Cabal, DO 12/03/22 1126

## 2022-12-06 ENCOUNTER — Ambulatory Visit
Admission: EM | Admit: 2022-12-06 | Discharge: 2022-12-06 | Disposition: A | Discharge status: Home / Self Care | Payer: Medicare Other | Attending: Emergency Medicine | Admitting: Emergency Medicine

## 2022-12-06 DIAGNOSIS — R051 Acute cough: Secondary | ICD-10-CM

## 2022-12-06 DIAGNOSIS — J069 Acute upper respiratory infection, unspecified: Secondary | ICD-10-CM | POA: Diagnosis not present

## 2022-12-06 MED ORDER — DOXYCYCLINE HYCLATE 100 MG PO CAPS: 100.0000 mg | ORAL_CAPSULE | Freq: Two times a day (BID) | ORAL | 0 refills | Status: DC

## 2022-12-06 NOTE — ED Provider Notes (Signed)
[facility]    CSN: [id number] Arrival date & time: 12/06/22  0847      History   Chief Complaint Chief Complaint  Patient presents with   Cough    HPI Ricky Li is a 72 y.o. male.   72 year old male pt, Ricky Li, presents to urgent care for evaluation of persistant worsening cough. Pt states he was seen ast week for same and is getting worse, coughing up and blowing "brown stuff". Pt states he feels much worse.   The history is provided by the patient. No language interpreter was used.    Past Medical History:  Diagnosis Date   Kidney stones     Patient Active Problem List   Diagnosis Date Noted   Acute URI 12/06/2022   Acute cough 12/06/2022    Past Surgical History:  Procedure Laterality Date   HERNIA REPAIR         Home Medications    Prior to Admission medications   Medication Sig Start Date End Date Taking? Authorizing Provider  benzonatate (TESSALON) 100 MG capsule Take 1 capsule (100 mg total) by mouth every 8 (eight) hours. 12/03/22  Yes Brimage, Vondra, DO  chlorpheniramine-HYDROcodone (TUSSIONEX) 10-8 MG/5ML Take 5 mLs by mouth every 12 (twelve) hours as needed for cough. 12/03/22  Yes Brimage, Vondra, DO  doxycycline (VIBRAMYCIN) 100 MG capsule Take 1 capsule (100 mg total) by mouth 2 (two) times daily. 12/06/22  Yes Markan Cazarez, Para March, NP  ipratropium (ATROVENT) 0.06 % nasal spray Place 2 sprays into both nostrils 4 (four) times daily as needed for rhinitis. 12/01/17 06/10/20  Tommie Sams, DO    Family History History reviewed. No pertinent family history.  Social History Social History   Tobacco Use   Smoking status: Never   Smokeless tobacco: Never  Vaping Use   Vaping status: Never Used  Substance Use Topics   Alcohol use: Yes    Comment: socially   Drug use: No     Allergies   Patient has no known allergies.   Review of Systems Review of Systems  Constitutional:  Negative for fever.  HENT:  Positive for  congestion and sinus pressure.   Respiratory:  Positive for cough.   All other systems reviewed and are negative.    Physical Exam Triage Vital Signs ED Triage Vitals [12/06/22 0943]  Encounter Vitals Group     BP (!) 117/93     Systolic BP Percentile      Diastolic BP Percentile      Pulse Rate 67     Resp 19     Temp 98.3 F (36.8 C)     Temp Source Oral     SpO2 94 %     Weight      Height      Head Circumference      Peak Flow      Pain Score      Pain Loc      Pain Education      Exclude from Growth Chart    No data found.  Updated Vital Signs BP (!) 117/93 (BP Location: Left Arm)   Pulse 67   Temp 98.3 F (36.8 C) (Oral)   Resp 19   SpO2 94%   Visual Acuity Right Eye Distance:   Left Eye Distance:   Bilateral Distance:    Right Eye Near:   Left Eye Near:    Bilateral Near:     Physical Exam Vitals and  nursing note reviewed.  Constitutional:      General: He is active. He is not in acute distress.    Appearance: He is well-developed and well-groomed. He is not ill-appearing or toxic-appearing.  HENT:     Head: Normocephalic.     Right Ear: Tympanic membrane is retracted.     Left Ear: Tympanic membrane is retracted.     Nose: Mucosal edema and congestion present.     Right Sinus: Maxillary sinus tenderness present. No frontal sinus tenderness.     Left Sinus: Maxillary sinus tenderness present. No frontal sinus tenderness.     Comments: Thick yellow drainage noted    Mouth/Throat:     Lips: Pink.     Mouth: Oropharynx is clear and moist and mucous membranes are normal. Mucous membranes are moist.     Pharynx: Uvula midline. Postnasal drip present.  Eyes:     General: Lids are normal.     Extraocular Movements: EOM normal.     Conjunctiva/sclera: Conjunctivae normal.     Pupils: Pupils are equal, round, and reactive to light.  Cardiovascular:     Rate and Rhythm: Normal rate and regular rhythm.     Pulses: Normal pulses.     Heart sounds:  Normal heart sounds.  Pulmonary:     Effort: Pulmonary effort is normal. No respiratory distress.     Breath sounds: Normal breath sounds and air entry. No decreased breath sounds or wheezing.  Abdominal:     General: There is no distension.     Palpations: Abdomen is soft.  Musculoskeletal:        General: Normal range of motion.     Cervical back: Normal range of motion.  Skin:    General: Skin is warm, dry and intact.     Findings: No rash.  Neurological:     General: No focal deficit present.     Mental Status: He is alert and oriented to person, place, and time.     GCS: GCS eye subscore is 4. GCS verbal subscore is 5. GCS motor subscore is 6.     Cranial Nerves: No cranial nerve deficit.     Sensory: No sensory deficit.  Psychiatric:        Attention and Perception: Attention normal.        Mood and Affect: Mood and affect and mood normal.        Speech: Speech normal.        Behavior: Behavior normal. Behavior is cooperative.      UC Treatments / Results  Labs (all labs ordered are listed, but only abnormal results are displayed) Labs Reviewed - No data to display  EKG   Radiology No results found.  Procedures Procedures (including critical care time)  Medications Ordered in UC Medications - No data to display  Initial Impression / Assessment and Plan / UC Course  I have reviewed the triage vital signs and the nursing notes.  Pertinent labs & imaging results that were available during my care of the patient were reviewed by me and considered in my medical decision making (see chart for details).    Discussed exam findings and plan of care with patient, strict go to ER precautions given.   Patient verbalized understanding to this provider.  Ddx: Acute URI, Acute cough, allergies, viral illness Final Clinical Impressions(s) / UC Diagnoses   Final diagnoses:  Acute URI  Acute cough     Discharge Instructions      Take  antibiotic as directed.  Rest,  push fluids.  Follow-up with PCP.  Return as needed.  If you develop shortness of breath chest pain worsening symptoms go to the emergency room for further evaluation.     ED Prescriptions     Medication Sig Dispense Auth. Provider   doxycycline (VIBRAMYCIN) 100 MG capsule Take 1 capsule (100 mg total) by mouth 2 (two) times daily. 20 capsule Mellody Masri, Para March, NP      PDMP not reviewed this encounter.   Clancy Gourd, NP 12/06/22 [phone]

## 2022-12-06 NOTE — ED Triage Notes (Signed)
Patient states that he has a lingering cough that became productive yesterday.

## 2022-12-06 NOTE — Discharge Instructions (Signed)
Take antibiotic as directed.  Rest, push fluids.  Follow-up with PCP.  Return as needed.  If you develop shortness of breath chest pain worsening symptoms go to the emergency room for further evaluation.

## 2023-11-16 ENCOUNTER — Encounter: Payer: Self-pay | Admitting: *Deleted

## 2023-11-17 ENCOUNTER — Emergency Department
Admission: EM | Admit: 2023-11-17 | Discharge: 2023-11-17 | Disposition: A | Discharge status: Home / Self Care | Payer: PRIVATE HEALTH INSURANCE | Attending: Emergency Medicine | Admitting: Emergency Medicine

## 2023-11-17 ENCOUNTER — Other Ambulatory Visit: Payer: Self-pay

## 2023-11-17 ENCOUNTER — Emergency Department: Payer: PRIVATE HEALTH INSURANCE

## 2023-11-17 DIAGNOSIS — N202 Calculus of kidney with calculus of ureter: Secondary | ICD-10-CM | POA: Diagnosis not present

## 2023-11-17 DIAGNOSIS — R10A1 Flank pain, right side: Secondary | ICD-10-CM | POA: Diagnosis present

## 2023-11-17 DIAGNOSIS — I10 Essential (primary) hypertension: Secondary | ICD-10-CM | POA: Insufficient documentation

## 2023-11-17 DIAGNOSIS — N200 Calculus of kidney: Secondary | ICD-10-CM

## 2023-11-17 DIAGNOSIS — R110 Nausea: Secondary | ICD-10-CM

## 2023-11-17 LAB — URINALYSIS, ROUTINE W REFLEX MICROSCOPIC
Bacteria, UA: NONE SEEN
Bilirubin Urine: NEGATIVE
Glucose, UA: NEGATIVE mg/dL
Ketones, ur: 20 mg/dL — AB
Leukocytes,Ua: NEGATIVE
Nitrite: NEGATIVE
Protein, ur: NEGATIVE mg/dL
Specific Gravity, Urine: 1.028 (ref 1.005–1.030)
Squamous Epithelial / HPF: 0 /HPF (ref 0–5)
pH: 5 (ref 5.0–8.0)

## 2023-11-17 LAB — CBC
HCT: 45.6 % (ref 39.0–52.0)
Hemoglobin: 15.9 g/dL (ref 13.0–17.0)
MCH: 31.2 pg (ref 26.0–34.0)
MCHC: 34.9 g/dL (ref 30.0–36.0)
MCV: 89.4 fL (ref 80.0–100.0)
Platelets: 240 K/uL (ref 150–400)
RBC: 5.1 MIL/uL (ref 4.22–5.81)
RDW: 12.6 % (ref 11.5–15.5)
WBC: 9.6 K/uL (ref 4.0–10.5)
nRBC: 0 % (ref 0.0–0.2)

## 2023-11-17 LAB — BASIC METABOLIC PANEL WITH GFR
Anion gap: 11 (ref 5–15)
BUN: 23 mg/dL (ref 8–23)
CO2: 21 mmol/L — ABNORMAL LOW (ref 22–32)
Calcium: 8.9 mg/dL (ref 8.9–10.3)
Chloride: 107 mmol/L (ref 98–111)
Creatinine, Ser: 1.41 mg/dL — ABNORMAL HIGH (ref 0.61–1.24)
GFR, Estimated: 53 mL/min — ABNORMAL LOW (ref >60)
Glucose, Bld: 110 mg/dL — ABNORMAL HIGH (ref 70–99)
Potassium: 4.5 mmol/L (ref 3.5–5.1)
Sodium: 139 mmol/L (ref 135–145)

## 2023-11-17 MED ORDER — OXYCODONE-ACETAMINOPHEN 5-325 MG PO TABS
1.0000 | ORAL_TABLET | ORAL | Status: DC | PRN
  Administered 2023-11-17: 1 via ORAL
  Filled 2023-11-17: qty 1

## 2023-11-17 MED ORDER — OXYCODONE HCL 5 MG PO CAPS: 5.0000 mg | ORAL_CAPSULE | Freq: Four times a day (QID) | ORAL | 0 refills | Status: AC | PRN

## 2023-11-17 MED ORDER — ONDANSETRON 4 MG PO TBDP
4.0000 mg | ORAL_TABLET | Freq: Once | ORAL | Status: AC
Start: 2023-11-17 — End: 2023-11-17

## 2023-11-17 MED ORDER — TAMSULOSIN HCL 0.4 MG PO CAPS
0.4000 mg | ORAL_CAPSULE | Freq: Once | ORAL | Status: AC
  Administered 2023-11-17: 0.4 mg via ORAL
  Filled 2023-11-17: qty 1

## 2023-11-17 MED ORDER — ONDANSETRON 4 MG PO TBDP
ORAL_TABLET | ORAL | Status: AC
  Administered 2023-11-17: 4 mg via ORAL
  Filled 2023-11-17: qty 1

## 2023-11-17 MED ORDER — ONDANSETRON 4 MG PO TBDP: 4.0000 mg | ORAL_TABLET | Freq: Three times a day (TID) | ORAL | 0 refills | Status: AC | PRN

## 2023-11-17 MED ORDER — TAMSULOSIN HCL 0.4 MG PO CAPS: 0.4000 mg | ORAL_CAPSULE | Freq: Every day | ORAL | 0 refills | Status: DC

## 2023-11-17 MED ORDER — SODIUM CHLORIDE 0.9 % IV BOLUS
1000.0000 mL | Freq: Once | INTRAVENOUS | Status: AC
  Administered 2023-11-17: 1000 mL via INTRAVENOUS

## 2023-11-17 MED ORDER — MORPHINE SULFATE (PF) 4 MG/ML IV SOLN
4.0000 mg | Freq: Once | INTRAVENOUS | Status: AC
  Administered 2023-11-17: 4 mg via INTRAVENOUS
  Filled 2023-11-17: qty 1

## 2023-11-17 NOTE — ED Triage Notes (Signed)
 Pt via POV from home. Pt c/o R sided flank pain that started this AM. Denies urinary symptoms. Pt is A&Ox4 but uncomfortable from the pain.

## 2023-11-17 NOTE — ED Provider Notes (Signed)
 [facility] Provider Note    Event Date/Time   First MD Initiated Contact with Patient 11/17/23 1821     (approximate)   History   Flank Pain   HPI  Ricky Li is a 73 y.o. male history of nephrolithiasis, hypertension, BPH, presenting with right back pain and right flank pain.  States that started with back pain and move to his right lower quadrant.  States that he felt very similar to prior kidney stones.  States that he has some nausea but no vomiting, no urinary symptoms, no diarrhea fever, no body aches.  Per independent history from wife, he took some Tylenol  which helped slightly with the pain.  Has not been to see his urologist in a long time.       Physical Exam   Triage Vital Signs: ED Triage Vitals  Encounter Vitals Group     BP 11/17/23 1644 (!) 125/48     Girls Systolic BP Percentile --      Girls Diastolic BP Percentile --      Boys Systolic BP Percentile --      Boys Diastolic BP Percentile --      Pulse Rate 11/17/23 1644 61     Resp 11/17/23 1644 18     Temp 11/17/23 1644 98.2 F (36.8 C)     Temp Source 11/17/23 1644 Oral     SpO2 11/17/23 1644 99 %     Weight 11/17/23 1641 239 lb (108.4 kg)     Height 11/17/23 1641 5' 10 (1.778 m)     Head Circumference --      Peak Flow --      Pain Score 11/17/23 1641 7     Pain Loc --      Pain Education --      Exclude from Growth Chart --     Most recent vital signs: Vitals:   11/17/23 1644  BP: (!) 125/48  Pulse: 61  Resp: 18  Temp: 98.2 F (36.8 C)  SpO2: 99%     General: Awake, no distress.  CV:  Good peripheral perfusion.  Resp:  Normal effort.  Abd:  No distention.  Soft nontender Other:  No CVA or flank tenderness bilaterally, no overlying rash.   ED Results / Procedures / Treatments   Labs (all labs ordered are listed, but only abnormal results are displayed) Labs Reviewed  URINALYSIS, ROUTINE W REFLEX MICROSCOPIC - Abnormal; Notable for the  following components:      Result Value   Color, Urine YELLOW (*)    APPearance CLEAR (*)    Hgb urine dipstick LARGE (*)    Ketones, ur 20 (*)    All other components within normal limits  BASIC METABOLIC PANEL WITH GFR - Abnormal; Notable for the following components:   CO2 21 (*)    Glucose, Bld 110 (*)    Creatinine, Ser 1.41 (*)    GFR, Estimated 53 (*)    All other components within normal limits  CBC      RADIOLOGY On my independent interpretation, CT shows right nephrolithiasis.   PROCEDURES:  Critical Care performed: No  Procedures   MEDICATIONS ORDERED IN ED: Medications  oxyCODONE -acetaminophen  (PERCOCET/ROXICET) 5-325 MG per tablet 1 tablet (1 tablet Oral Given 11/17/23 1649)  ondansetron  (ZOFRAN -ODT) disintegrating tablet 4 mg (4 mg Oral Given 11/17/23 1649)  sodium chloride  0.9 % bolus 1,000 mL (1,000 mLs Intravenous New Bag/Given 11/17/23 1855)  morphine (PF) 4 MG/ML  injection 4 mg (4 mg Intravenous Given 11/17/23 1853)  tamsulosin  (FLOMAX ) capsule 0.4 mg (0.4 mg Oral Given 11/17/23 1855)     IMPRESSION / MDM / ASSESSMENT AND PLAN / ED COURSE  I reviewed the triage vital signs and the nursing notes.                              Differential diagnosis includes, but is not limited to, nephrolithiasis, musculoskeletal pain, strain, UTI.  Labs, UA, CT.  He was given Zofran  out of triage.  Also given some IV morphine for pain.  Patient's presentation is most consistent with acute presentation with potential threat to life or bodily function.  Independent interpretation of labs and imaging below.  Will give him some Flomax .  UA not consistent with UTI.  Did discuss with him about outpatient follow-up with urology, will give him a short prescription for oxycodone  for severe breakthrough pain, given the AKI, encouraged hydration, discussed avoiding NSAIDs until his kidney function normalizes.  He can take Tylenol  as needed for pain as well.  Also prescribed some  Zofran .  Otherwise considered but no indication for inpatient admission at this time, he is safe for outpatient management.  Will discharge with strict precautions.  Shared decision making done with patient and wife and they are agreeable with this plan.    Clinical Course as of 11/17/23 1919  Thu Nov 17, 2023  8175 Independent review of labs, no leukocytosis, electrolytes not severely deranged, he has a mild AKI.  Will give him some fluids here. [TT]  1824 CT Renal Stone Study IMPRESSION: 1. A 3 mm distal right ureteral stone. No hydronephrosis. 2. No bowel obstruction.   [TT]  1918 Urinalysis, Routine w reflex microscopic -Urine, Clean Catch(!) UA not consistent with UTI. [TT]    Clinical Course User Index [TT] Waymond Lorelle Cummins, MD     FINAL CLINICAL IMPRESSION(S) / ED DIAGNOSES   Final diagnoses:  Right flank pain  Nephrolithiasis  Nausea     Rx / DC Orders   ED Discharge Orders          Ordered    tamsulosin  (FLOMAX ) 0.4 MG CAPS capsule  Daily        11/17/23 1834    oxycodone  (OXY-IR) 5 MG capsule  Every 6 hours PRN        11/17/23 1834    ondansetron  (ZOFRAN -ODT) 4 MG disintegrating tablet  Every 8 hours PRN        11/17/23 1919             Note:  This document was prepared using Dragon voice recognition software and may include unintentional dictation errors.    Waymond Lorelle Cummins, MD 11/17/23 [phone]

## 2023-11-17 NOTE — ED Notes (Signed)
 Pt discharged to home.  Instructions and medications reviewed with pt spouse.  Spouse verbalized understanding, now questions from pt of spouse at this time.

## 2023-11-17 NOTE — Discharge Instructions (Addendum)
 Please avoid taking any NSAIDs like ibuprofen until your kidney function is normalized.  Please make sure to follow-up with your primary care doctor next week to get repeat renal function labs.  Please reserve the oxycodone  for severe breakthrough pain.  Please do not drive or operate heavy machinery when you are on the oxycodone .  Otherwise you can take 650 mg of Tylenol  every 6 hours as needed for pain.  Also use the Flomax  as prescribed.  I put in the number for you to call for urology as well.

## 2023-11-23 ENCOUNTER — Ambulatory Visit (INDEPENDENT_AMBULATORY_CARE_PROVIDER_SITE_OTHER): Admitting: Urology

## 2023-11-23 VITALS — BP 102/70 | HR 71 | Ht 70.0 in | Wt 238.0 lb

## 2023-11-23 DIAGNOSIS — N200 Calculus of kidney: Secondary | ICD-10-CM | POA: Diagnosis not present

## 2023-11-23 MED ORDER — KETOROLAC TROMETHAMINE 10 MG PO TABS: 10.0000 mg | ORAL_TABLET | Freq: Four times a day (QID) | ORAL | 0 refills | Status: DC | PRN

## 2023-11-23 NOTE — Patient Instructions (Signed)
 [facility]

## 2023-11-23 NOTE — Progress Notes (Signed)
   11/23/23 2:51 PM   Ricky Li 1951-01-11 [id number]  CC: Right ureteral stone, PSA screening  HPI: 73 year old male with history of recurrent stone disease who presented to the ER on 11/17/2023 with nausea and pain with CT showing a 3 mm right distal ureteral stone with minimal hydronephrosis.  Urinalysis showed microscopic hematuria and labs were overall benign and he was discharged with medical expulsive therapy.  Previously was followed by Dr. Ike, has required shockwave lithotripsy and ureteroscopy in the past.  Continues to have some pelvic pressure and discomfort, but no severe pain over the last 1 to 2 days.  His wife is a recurrent stone patient as well.  PSA April 2025 normal at 2.24  PMH: Past Medical History:  Diagnosis Date   Enlarged prostate with lower urinary tract symptoms (LUTS)    Hypertension    Incomplete bladder emptying    Kidney stones    Seborrheic keratosis     Surgical History: Past Surgical History:  Procedure Laterality Date    multiple lithotripsy N/A    BONE FLAP RECONSTRUCTION SKULL N/A    HERNIA REPAIR       Family History: No family history on file.  Social History:  reports that he has never smoked. He has never used smokeless tobacco. He reports current alcohol use. He reports that he does not use drugs.  Physical Exam: BP 102/70 (BP Location: Left Arm, Patient Position: Sitting, Cuff Size: Large)   Pulse 71   Ht 5' 10 (1.778 m)   Wt 238 lb (108 kg)   SpO2 93%   BMI 34.15 kg/m    Constitutional:  Alert and oriented, No acute distress. Cardiovascular: No clubbing, cyanosis, or edema. Respiratory: Normal respiratory effort, no increased work of breathing. GI: Abdomen is soft, nontender, nondistended, no abdominal masses  Laboratory Data: Reviewed, see HPI  Pertinent Imaging: I have personally viewed and interpreted the CT scan showing a small 3 mm right distal ureteral stone with mild hydronephrosis, no other renal  stones.  Assessment & Plan:   73 year old male with 3 mm right distal ureteral stone, no clinical or laboratory evidence of infection, pain currently moderately controlled.  We discussed various treatment options for urolithiasis including observation with or without medical expulsive therapy, shockwave lithotripsy (SWL), ureteroscopy and laser lithotripsy with stent placement, and percutaneous nephrolithotomy.We discussed that management is based on stone size, location, density, patient co-morbidities, and patient preference. Stones <68mm in size have a >80% spontaneous passage rate. Data surrounding the use of tamsulosin  for medical expulsive therapy is controversial, but meta analyses suggests it is most efficacious for distal stones between 5-73mm in size. Possible side effects include dizziness/lightheadedness, and retrograde ejaculation.SWL has a lower stone free rate in a single procedure, but also a lower complication rate compared to ureteroscopy and avoids a stent and associated stent related symptoms. Possible complications include renal hematoma, steinstrasse, and need for additional treatment.Ureteroscopy with laser lithotripsy and stent placement has a higher stone free rate than SWL in a single procedure, however increased complication rate including possible infection, ureteral injury, bleeding, and stent related morbidity. Common stent related symptoms include dysuria, urgency/frequency, and flank pain.  He prefers medical expulsive therapy, prescription for Toradol  sent in for acute renal colic and return precautions discussed RTC 2 weeks symptom check to confirm stone passage   Redell Burnet, MD 11/23/2023  [facility] [address], [address] [place], [place] [address] [phone]

## 2023-12-02 ENCOUNTER — Encounter
Admission: RE | Disposition: A | Discharge status: Home / Self Care | Payer: Self-pay | Source: Home / Self Care | Attending: Gastroenterology

## 2023-12-02 ENCOUNTER — Ambulatory Visit: Admitting: Anesthesiology

## 2023-12-02 ENCOUNTER — Ambulatory Visit
Admission: RE | Admit: 2023-12-02 | Discharge: 2023-12-02 | Disposition: A | Discharge status: Home / Self Care | Attending: Gastroenterology | Admitting: Gastroenterology

## 2023-12-02 DIAGNOSIS — Z6832 Body mass index (BMI) 32.0-32.9, adult: Secondary | ICD-10-CM | POA: Insufficient documentation

## 2023-12-02 DIAGNOSIS — K640 First degree hemorrhoids: Secondary | ICD-10-CM | POA: Insufficient documentation

## 2023-12-02 DIAGNOSIS — Z1211 Encounter for screening for malignant neoplasm of colon: Secondary | ICD-10-CM | POA: Insufficient documentation

## 2023-12-02 DIAGNOSIS — E669 Obesity, unspecified: Secondary | ICD-10-CM | POA: Diagnosis not present

## 2023-12-02 DIAGNOSIS — I10 Essential (primary) hypertension: Secondary | ICD-10-CM | POA: Insufficient documentation

## 2023-12-02 DIAGNOSIS — N401 Enlarged prostate with lower urinary tract symptoms: Secondary | ICD-10-CM | POA: Diagnosis not present

## 2023-12-02 DIAGNOSIS — Z79899 Other long term (current) drug therapy: Secondary | ICD-10-CM | POA: Diagnosis not present

## 2023-12-02 HISTORY — DX: Other seborrheic keratosis: L82.1

## 2023-12-02 HISTORY — DX: Essential (primary) hypertension: I10

## 2023-12-02 HISTORY — DX: Retention of urine, unspecified: R33.9

## 2023-12-02 HISTORY — DX: Benign prostatic hyperplasia with lower urinary tract symptoms: N40.1

## 2023-12-02 SURGERY — COLONOSCOPY
Anesthesia: General

## 2023-12-02 MED ORDER — LIDOCAINE HCL (CARDIAC) PF 100 MG/5ML IV SOSY
PREFILLED_SYRINGE | INTRAVENOUS | Status: DC | PRN
  Administered 2023-12-02: 80 mg via INTRAVENOUS

## 2023-12-02 MED ORDER — PROPOFOL 500 MG/50ML IV EMUL
INTRAVENOUS | Status: DC | PRN
  Administered 2023-12-02: 75 ug/kg/min via INTRAVENOUS

## 2023-12-02 MED ORDER — DEXMEDETOMIDINE HCL IN NACL 80 MCG/20ML IV SOLN
INTRAVENOUS | Status: DC | PRN
  Administered 2023-12-02: 12 ug via INTRAVENOUS
  Administered 2023-12-02: 8 ug via INTRAVENOUS

## 2023-12-02 MED ORDER — SIMETHICONE 40 MG/0.6ML PO SUSP
ORAL | Status: DC | PRN
  Administered 2023-12-02 (×2): 60 mL

## 2023-12-02 MED ORDER — DEXMEDETOMIDINE HCL IN NACL 80 MCG/20ML IV SOLN
INTRAVENOUS | Status: AC
  Filled 2023-12-02: qty 40

## 2023-12-02 MED ORDER — SODIUM CHLORIDE 0.9 % IV SOLN: INTRAVENOUS | Status: DC

## 2023-12-02 MED ORDER — LIDOCAINE HCL (PF) 2 % IJ SOLN
INTRAMUSCULAR | Status: AC
  Filled 2023-12-02: qty 5

## 2023-12-02 MED ORDER — PROPOFOL 10 MG/ML IV BOLUS
INTRAVENOUS | Status: DC | PRN
  Administered 2023-12-02: 50 mg via INTRAVENOUS
  Administered 2023-12-02: 30 mg via INTRAVENOUS

## 2023-12-02 NOTE — H&P (Signed)
 Outpatient short stay form Pre-procedure 12/02/2023  Ole ONEIDA Schick, MD  Primary Physician: Auston Reyes BIRCH, MD  Reason for visit:  Screening  History of present illness:    73 y/o gentleman with history of BPH and hypertension here for screening colonoscopy. Last colonoscopy many years ago, no report could be located. History of umbilical hernia repair. No blood thinners. No family history of GI malignancies.    Current Facility-Administered Medications:    0.9 %  sodium chloride  infusion, , Intravenous, Continuous, Lichelle Viets, Ole ONEIDA, MD, Last Rate: 20 mL/hr at 12/02/23 1048, Continued from Pre-op at 12/02/23 1048  Medications Prior to Admission  Medication Sig Dispense Refill Last Dose/Taking   oxycodone  (OXY-IR) 5 MG capsule Take 1 capsule (5 mg total) by mouth every 6 (six) hours as needed for up to 8 doses. 8 capsule 0 Past Week   tamsulosin  (FLOMAX ) 0.4 MG CAPS capsule Take 1 capsule (0.4 mg total) by mouth daily. 30 capsule 0 12/01/2023   chlorpheniramine-HYDROcodone (TUSSIONEX) 10-8 MG/5ML Take 5 mLs by mouth every 12 (twelve) hours as needed for cough. 70 mL 0    ketorolac  (TORADOL ) 10 MG tablet Take 1 tablet (10 mg total) by mouth every 6 (six) hours as needed for severe pain (pain score 7-10). (Patient not taking: Reported on 12/02/2023) 15 tablet 0 Not Taking   ondansetron  (ZOFRAN -ODT) 4 MG disintegrating tablet Take 1 tablet (4 mg total) by mouth every 8 (eight) hours as needed for nausea or vomiting. 20 tablet 0      No Known Allergies   Past Medical History:  Diagnosis Date   Enlarged prostate with lower urinary tract symptoms (LUTS)    Hypertension    Incomplete bladder emptying    Kidney stones    Seborrheic keratosis     Review of systems:  Otherwise negative.    Physical Exam  Gen: Alert, oriented. Appears stated age.  HEENT: PERRLA. Lungs: No respiratory distress CV: RRR Abd: soft, benign, no masses Ext: No edema    Planned procedures:  Proceed with colonoscopy. The patient understands the nature of the planned procedure, indications, risks, alternatives and potential complications including but not limited to bleeding, infection, perforation, damage to internal organs and possible oversedation/side effects from anesthesia. The patient agrees and gives consent to proceed.  Please refer to procedure notes for findings, recommendations and patient disposition/instructions.     Ole ONEIDA Schick, MD [facility]

## 2023-12-02 NOTE — Transfer of Care (Signed)
 Immediate Anesthesia Transfer of Care Note  Patient: Ricky Li  Procedure(s) Performed: COLONOSCOPY  Patient Location: PACU  Anesthesia Type:General  Level of Consciousness: sedated  Airway & Oxygen Therapy: Patient Spontanous Breathing  Post-op Assessment: Report given to RN and Post -op Vital signs reviewed and stable  Post vital signs: Reviewed and stable  Last Vitals:  Vitals Value Taken Time  BP 92/55 12/02/23 11:36  Temp    Pulse 59 12/02/23 11:37  Resp 14 12/02/23 11:37  SpO2 96 % 12/02/23 11:37  Vitals shown include unfiled device data.  Last Pain:  Vitals:   12/02/23 0959  TempSrc: Temporal         Complications: No notable events documented.

## 2023-12-02 NOTE — Anesthesia Postprocedure Evaluation (Signed)
 Anesthesia Post Note  Patient: Ricky Li  Procedure(s) Performed: COLONOSCOPY  Patient location during evaluation: PACU Anesthesia Type: General Level of consciousness: awake Pain management: pain level controlled Vital Signs Assessment: post-procedure vital signs reviewed and stable Respiratory status: spontaneous breathing Cardiovascular status: stable Anesthetic complications: no   No notable events documented.   Last Vitals:  Vitals:   12/02/23 0959  BP: (!) 139/93  Pulse: 62  Resp: 16  Temp: (!) 35.8 C  SpO2: 98%    Last Pain:  Vitals:   12/02/23 0959  TempSrc: Temporal                 VAN STAVEREN,Tarin Johndrow

## 2023-12-02 NOTE — Op Note (Signed)
 [facility] Patient Name: Ricky Li Procedure Date: 12/02/2023 11:03 AM MRN: [id number] Account #: [id number] Date of Birth: [birth year] Admit Type: Outpatient Age: 73 Room: [facility] ENDO ROOM 3 Gender: Male Note Status: Finalized Instrument Name: Colon Scope [id number] Procedure:             Colonoscopy Indications:           Screening for colorectal malignant neoplasm Providers:             Ole Schick MD, MD Medicines:             Monitored Anesthesia Care Complications:         No immediate complications. Procedure:             Pre-Anesthesia Assessment:                        - Prior to the procedure, a History and Physical was                         performed, and patient medications and allergies were                         reviewed. The patient is competent. The risks and                         benefits of the procedure and the sedation options and                         risks were discussed with the patient. All questions                         were answered and informed consent was obtained.                         Patient identification and proposed procedure were                         verified by the physician, the nurse, the                         anesthesiologist, the anesthetist and the technician                         in the endoscopy suite. Mental Status Examination:                         alert and oriented. Airway Examination: normal                         oropharyngeal airway and neck mobility. Respiratory                         Examination: clear to auscultation. CV Examination:                         normal. Prophylactic Antibiotics: The patient does not                         require prophylactic antibiotics. Prior  Anticoagulants: The patient has taken no anticoagulant                         or antiplatelet agents. ASA Grade Assessment: II - A                         patient with mild  systemic disease. After reviewing                         the risks and benefits, the patient was deemed in                         satisfactory condition to undergo the procedure. The                         anesthesia plan was to use monitored anesthesia care                         (MAC). Immediately prior to administration of                         medications, the patient was re-assessed for adequacy                         to receive sedatives. The heart rate, respiratory                         rate, oxygen saturations, blood pressure, adequacy of                         pulmonary ventilation, and response to care were                         monitored throughout the procedure. The physical                         status of the patient was re-assessed after the                         procedure.                        After obtaining informed consent, the colonoscope was                         passed under direct vision. Throughout the procedure,                         the patient's blood pressure, pulse, and oxygen                         saturations were monitored continuously. The                         Colonoscope was introduced through the anus and                         advanced to the the cecum, identified by appendiceal  orifice and ileocecal valve. The colonoscopy was                         somewhat difficult due to significant looping.                         Successful completion of the procedure was aided by                         applying abdominal pressure. The patient tolerated the                         procedure well. The quality of the bowel preparation                         was adequate to identify polyps. The ileocecal valve,                         appendiceal orifice, and rectum were photographed. Findings:      The perianal and digital rectal examinations were normal.      Internal hemorrhoids were found during retroflexion.  The hemorrhoids       were Grade I (internal hemorrhoids that do not prolapse).      The exam was otherwise without abnormality on direct and retroflexion       views. Impression:            - Internal hemorrhoids.                        - The examination was otherwise normal on direct and                         retroflexion views.                        - No specimens collected. Recommendation:        - Discharge patient to home.                        - Resume previous diet.                        - Continue present medications.                        - Repeat colonoscopy is not recommended due to current                         age (23 years or older) for screening purposes.                        - Return to referring physician as previously                         scheduled. Procedure Code(s):     --- Professional ---                        H9878, Colorectal cancer screening; colonoscopy on  individual not meeting criteria for high risk Diagnosis Code(s):     --- Professional ---                        Z12.11, Encounter for screening for malignant neoplasm                         of colon                        K64.0, First degree hemorrhoids CPT copyright 2022 American Medical Association. All rights reserved. The codes documented in this report are preliminary and upon coder review may  be revised to meet current compliance requirements. Ole Schick MD, MD 12/02/2023 11:38:19 AM Number of Addenda: 0 Note Initiated On: 12/02/2023 11:03 AM Scope Withdrawal Time: 0 hours 6 minutes 19 seconds  Total Procedure Duration: 0 hours 15 minutes 34 seconds  Estimated Blood Loss:  Estimated blood loss: none.      [facility]

## 2023-12-02 NOTE — Interval H&P Note (Signed)
 History and Physical Interval Note:  12/02/2023 11:08 AM  Ricky Li  has presented today for surgery, with the diagnosis of Encounter for screening colonoscopy for non-high-risk patient [Z12.11].  The various methods of treatment have been discussed with the patient and family. After consideration of risks, benefits and other options for treatment, the patient has consented to  Procedure(s): COLONOSCOPY (N/A) as a surgical intervention.  The patient's history has been reviewed, patient examined, no change in status, stable for surgery.  I have reviewed the patient's chart and labs.  Questions were answered to the patient's satisfaction.     Ole ONEIDA Schick  Ok to proceed with colonoscopy

## 2023-12-02 NOTE — Anesthesia Preprocedure Evaluation (Signed)
 Anesthesia Evaluation  Patient identified by MRN, date of birth, ID band Patient awake    Reviewed: Allergy & Precautions, NPO status , Patient's Chart, lab work & pertinent test results  Airway Mallampati: II  TM Distance: >3 FB Neck ROM: full    Dental  (+) Teeth Intact   Pulmonary neg pulmonary ROS   Pulmonary exam normal        Cardiovascular Exercise Tolerance: Good hypertension, Pt. on medications negative cardio ROS Normal cardiovascular exam Rhythm:Regular Rate:Normal     Neuro/Psych negative neurological ROS  negative psych ROS   GI/Hepatic negative GI ROS, Neg liver ROS,,,  Endo/Other  negative endocrine ROS    Renal/GU negative Renal ROS  negative genitourinary   Musculoskeletal   Abdominal  (+) + obese  Peds negative pediatric ROS (+)  Hematology negative hematology ROS (+)   Anesthesia Other Findings Past Medical History: No date: Enlarged prostate with lower urinary tract symptoms (LUTS) No date: Hypertension No date: Incomplete bladder emptying No date: Kidney stones No date: Seborrheic keratosis  Past Surgical History: No date:  multiple lithotripsy; N/A No date: BONE FLAP RECONSTRUCTION SKULL; N/A No date: HERNIA REPAIR  BMI    Body Mass Index: 32.28 kg/m      Reproductive/Obstetrics negative OB ROS                              Anesthesia Physical Anesthesia Plan  ASA: 2  Anesthesia Plan: General   Post-op Pain Management:    Induction: Intravenous  PONV Risk Score and Plan: Propofol infusion and TIVA  Airway Management Planned: Natural Airway and Nasal Cannula  Additional Equipment:   Intra-op Plan:   Post-operative Plan:   Informed Consent: I have reviewed the patients History and Physical, chart, labs and discussed the procedure including the risks, benefits and alternatives for the proposed anesthesia with the patient or authorized  representative who has indicated his/her understanding and acceptance.     Dental Advisory Given  Plan Discussed with: CRNA  Anesthesia Plan Comments:         Anesthesia Quick Evaluation

## 2023-12-08 ENCOUNTER — Ambulatory Visit (INDEPENDENT_AMBULATORY_CARE_PROVIDER_SITE_OTHER): Admitting: Physician Assistant

## 2023-12-08 VITALS — BP 105/69 | HR 88 | Ht 70.0 in | Wt 232.0 lb

## 2023-12-08 DIAGNOSIS — N201 Calculus of ureter: Secondary | ICD-10-CM

## 2023-12-08 NOTE — Progress Notes (Signed)
 12/08/2023 2:33 PM   Ricky Li 11/25/1950 [id number]  CC: Chief Complaint  Patient presents with   Follow-up   Nephrolithiasis   HPI: Ricky Li is a 73 y.o. male with PMH recurrent nephrolithiasis and a recent history of a 3 mm distal right ureteral stone who elected for MET who presents today for follow-up.   Today he reports no pelvic pain or pressure x 10 days.  He still taking Flomax .  He has spontaneously passed some stones in the past, but he is also had ureteroscopy x~2 and ESWL xseveral.  PMH: Past Medical History:  Diagnosis Date   Enlarged prostate with lower urinary tract symptoms (LUTS)    Hypertension    Incomplete bladder emptying    Kidney stones    Seborrheic keratosis     Surgical History: Past Surgical History:  Procedure Laterality Date    multiple lithotripsy N/A    BONE FLAP RECONSTRUCTION SKULL N/A    COLONOSCOPY N/A 12/02/2023   Procedure: COLONOSCOPY;  Surgeon: Maryruth Ole DASEN, MD;  Location: [facility];  Service: Endoscopy;  Laterality: N/A;   HERNIA REPAIR      Home Medications:  Allergies as of 12/08/2023   No Known Allergies      Medication List        Accurate as of December 08, 2023  2:33 PM. If you have any questions, ask your nurse or doctor.          STOP taking these medications    ketorolac  10 MG tablet Commonly known as: TORADOL        TAKE these medications    chlorpheniramine-HYDROcodone 10-8 MG/5ML Commonly known as: TUSSIONEX Take 5 mLs by mouth every 12 (twelve) hours as needed for cough.   ondansetron  4 MG disintegrating tablet Commonly known as: ZOFRAN -ODT Take 1 tablet (4 mg total) by mouth every 8 (eight) hours as needed for nausea or vomiting.   oxycodone  5 MG capsule Commonly known as: OXY-IR Take 1 capsule (5 mg total) by mouth every 6 (six) hours as needed for up to 8 doses.   tamsulosin  0.4 MG Caps capsule Commonly known as: FLOMAX  Take 1 capsule (0.4 mg total) by mouth  daily.        Allergies:  No Known Allergies  Family History: No family history on file.  Social History:   reports that he has never smoked. He has never used smokeless tobacco. He reports current alcohol use. He reports that he does not use drugs.  Physical Exam: BP 105/69 (BP Location: Left Arm, Patient Position: Sitting, Cuff Size: Large)   Pulse 88   Ht 5' 10 (1.778 m)   Wt 232 lb (105.2 kg)   SpO2 95%   BMI 33.29 kg/m   Constitutional:  Alert and oriented, no acute distress, nontoxic appearing HEENT: [place], AT Cardiovascular: No clubbing, cyanosis, or edema Respiratory: Normal respiratory effort, no increased work of breathing Skin: No rashes, bruises or suspicious lesions Neurologic: Grossly intact, no focal deficits, moving all 4 extremities Psychiatric: Normal mood and affect  Assessment & Plan:   1. Right ureteral stone (Primary) Small stone.  He never saw it pass, but has been asymptomatic for about 10 days.  I am having him leave a urine on the way out today.  If no hematuria, he can discontinue Flomax  and follow-up as needed.  If hematuria, will have him continue Flomax  and follow-up with imaging.  He is in agreement. - Urinalysis, Complete   Return for Will  call with results.  Lucie Hones, PA-C  [facility] [place] [address], [address] [place], [place] [address] [phone]

## 2023-12-10 ENCOUNTER — Ambulatory Visit: Payer: Self-pay | Admitting: Physician Assistant

## 2023-12-20 ENCOUNTER — Ambulatory Visit (INDEPENDENT_AMBULATORY_CARE_PROVIDER_SITE_OTHER): Admitting: Physician Assistant

## 2023-12-20 ENCOUNTER — Other Ambulatory Visit: Payer: Self-pay

## 2023-12-20 ENCOUNTER — Ambulatory Visit
Admission: RE | Admit: 2023-12-20 | Discharge: 2023-12-20 | Disposition: A | Discharge status: Home / Self Care | Source: Ambulatory Visit | Attending: Physician Assistant | Admitting: Physician Assistant

## 2023-12-20 ENCOUNTER — Ambulatory Visit
Admission: RE | Admit: 2023-12-20 | Discharge: 2023-12-20 | Disposition: A | Discharge status: Home / Self Care | Attending: Physician Assistant | Admitting: Physician Assistant

## 2023-12-20 VITALS — BP 121/82 | HR 71 | Ht 70.0 in | Wt 232.0 lb

## 2023-12-20 DIAGNOSIS — N201 Calculus of ureter: Secondary | ICD-10-CM | POA: Insufficient documentation

## 2023-12-20 LAB — URINALYSIS, COMPLETE
Bilirubin, UA: NEGATIVE
Glucose, UA: NEGATIVE
Ketones, UA: NEGATIVE
Leukocytes,UA: NEGATIVE
Nitrite, UA: NEGATIVE
Protein,UA: NEGATIVE
Specific Gravity, UA: 1.025 (ref 1.005–1.030)
Urobilinogen, Ur: 1 mg/dL (ref 0.2–1.0)
pH, UA: 6 (ref 5.0–7.5)

## 2023-12-20 LAB — MICROSCOPIC EXAMINATION

## 2023-12-20 MED ORDER — TAMSULOSIN HCL 0.4 MG PO CAPS: 0.4000 mg | ORAL_CAPSULE | Freq: Every day | ORAL | 0 refills | Status: AC

## 2023-12-20 NOTE — Progress Notes (Signed)
 12/20/2023 11:08 AM   Ricky Li Jun 27, 1950 [id number]  CC: Chief Complaint  Patient presents with   Follow-up   Nephrolithiasis   HPI: Ricky Li is a 73 y.o. male with PMH recurrent nephrolithiasis and a recent history of 3 mm distal right ureteral stone on MET who presents today for follow-up.   Today he reports he has remained asymptomatic of his stone since I last saw him 12 days ago.  He remains on Flomax .  KUB today with no radiopaque urolithiasis.  In-office UA today positive for 1+ blood; urine microscopy with 3-10 RBCs/HPF.  PMH: Past Medical History:  Diagnosis Date   Enlarged prostate with lower urinary tract symptoms (LUTS)    Hypertension    Incomplete bladder emptying    Kidney stones    Seborrheic keratosis     Surgical History: Past Surgical History:  Procedure Laterality Date    multiple lithotripsy N/A    BONE FLAP RECONSTRUCTION SKULL N/A    COLONOSCOPY N/A 12/02/2023   Procedure: COLONOSCOPY;  Surgeon: Ricky Ole DASEN, MD;  Location: [facility];  Service: Endoscopy;  Laterality: N/A;   HERNIA REPAIR      Home Medications:  Allergies as of 12/20/2023   No Known Allergies      Medication List        Accurate as of December 20, 2023 11:08 AM. If you have any questions, ask your nurse or doctor.          chlorpheniramine-HYDROcodone 10-8 MG/5ML Commonly known as: TUSSIONEX Take 5 mLs by mouth every 12 (twelve) hours as needed for cough.   ondansetron  4 MG disintegrating tablet Commonly known as: ZOFRAN -ODT Take 1 tablet (4 mg total) by mouth every 8 (eight) hours as needed for nausea or vomiting.   oxycodone  5 MG capsule Commonly known as: OXY-IR Take 1 capsule (5 mg total) by mouth every 6 (six) hours as needed for up to 8 doses.   tamsulosin  0.4 MG Caps capsule Commonly known as: FLOMAX  Take 1 capsule (0.4 mg total) by mouth daily.        Allergies:  No Known Allergies  Family History: No family  history on file.  Social History:   reports that he has never smoked. He has never used smokeless tobacco. He reports current alcohol use. He reports that he does not use drugs.  Physical Exam: BP 121/82 (BP Location: Left Arm, Patient Position: Sitting, Cuff Size: Large)   Pulse 71   Ht 5' 10 (1.778 m)   Wt 232 lb (105.2 kg)   SpO2 94%   BMI 33.29 kg/m   Constitutional:  Alert and oriented, no acute distress, nontoxic appearing HEENT: [place], AT Cardiovascular: No clubbing, cyanosis, or edema Respiratory: Normal respiratory effort, no increased work of breathing Skin: No rashes, bruises or suspicious lesions Neurologic: Grossly intact, no focal deficits, moving all 4 extremities Psychiatric: Normal mood and affect  Laboratory Data: Results for orders placed or performed in visit on 12/20/23  Microscopic Examination   Collection Time: 12/20/23  9:48 AM   Urine  Result Value Ref Range   WBC, UA 0-5 0 - 5 /hpf   RBC, Urine 3-10 (A) 0 - 2 /hpf   Epithelial Cells (non renal) 0-10 0 - 10 /hpf   Mucus, UA Present (A) Not Estab.   Bacteria, UA Few None seen/Few  Urinalysis, Complete   Collection Time: 12/20/23  9:48 AM  Result Value Ref Range   Specific Gravity, UA 1.025 1.005 -  1.030   pH, UA 6.0 5.0 - 7.5   Color, UA Yellow Yellow   Appearance Ur Clear Clear   Leukocytes,UA Negative Negative   Protein,UA Negative Negative/Trace   Glucose, UA Negative Negative   Ketones, UA Negative Negative   RBC, UA 1+ (A) Negative   Bilirubin, UA Negative Negative   Urobilinogen, Ur 1.0 0.2 - 1.0 mg/dL   Nitrite, UA Negative Negative   Microscopic Examination See below:    Pertinent Imaging: KUB, 12/20/2023: See epic  I personally reviewed the images referenced above and note no radiopaque urolithiasis.  Assessment & Plan:   1. Right ureteral stone (Primary) He remains asymptomatic of his small stone, having never seen it pass.  There are some persistent micro heme on his UA today  and I question a possible retained stone.  Unfortunately, no stone seen on KUB, and this could be a false negative.  Using shared decision making, we elected to continue Flomax  and recheck the urine in 4 weeks.  If he has persistent micro heme at that time, we will pursue CT stone study.  We discussed return precautions including fever, uncontrollable pain, and uncontrollable nausea/vomiting. - Urinalysis, Complete - tamsulosin  (FLOMAX ) 0.4 MG CAPS capsule; Take 1 capsule (0.4 mg total) by mouth daily.  Dispense: 30 capsule; Refill: 0   Return in about 4 weeks (around 01/17/2024) for Stone f/u with UA.  Lucie Hones, PA-C  [facility] [place] [address] [place], [place] [address] [phone]

## 2024-01-17 ENCOUNTER — Ambulatory Visit (INDEPENDENT_AMBULATORY_CARE_PROVIDER_SITE_OTHER): Admitting: Physician Assistant

## 2024-01-17 VITALS — BP 109/78 | HR 87

## 2024-01-17 DIAGNOSIS — N200 Calculus of kidney: Secondary | ICD-10-CM

## 2024-01-17 LAB — MICROSCOPIC EXAMINATION

## 2024-01-17 LAB — URINALYSIS, COMPLETE
Bilirubin, UA: NEGATIVE
Glucose, UA: NEGATIVE
Leukocytes,UA: NEGATIVE
Nitrite, UA: NEGATIVE
Protein,UA: NEGATIVE
Specific Gravity, UA: 1.03 (ref 1.005–1.030)
Urobilinogen, Ur: 0.2 mg/dL (ref 0.2–1.0)
pH, UA: 6 (ref 5.0–7.5)

## 2024-01-17 NOTE — Patient Instructions (Signed)
 Please call [phone] to schedule your CT scan.  Please contact our office once your scan is done to let us  know so that I can review the images myself.

## 2024-01-17 NOTE — Progress Notes (Signed)
 01/17/2024 11:43 AM   Ricky Li 07/20/1950 [id number]  CC: Chief Complaint  Patient presents with   Follow-up   Nephrolithiasis   HPI: Ricky Li is a 73 y.o. male with PMH recurrent nephrolithiasis and recent history of 3 mm distal right ureteral stone on MET who has had persistent microscopic hematuria concerning for retained stone who presents today for follow-up.   Today he reports he remains asymptomatic with no flank pain or dysuria.  He has no acute concerns today.  In-office UA today positive for 2+ blood and trace ketones; urine microscopy with 3-10 RBCs/HPF and moderate bacteria.   PMH: Past Medical History:  Diagnosis Date   Enlarged prostate with lower urinary tract symptoms (LUTS)    Hypertension    Incomplete bladder emptying    Kidney stones    Seborrheic keratosis     Surgical History: Past Surgical History:  Procedure Laterality Date    multiple lithotripsy N/A    BONE FLAP RECONSTRUCTION SKULL N/A    COLONOSCOPY N/A 12/02/2023   Procedure: COLONOSCOPY;  Surgeon: Maryruth Ole DASEN, MD;  Location: [facility] ENDOSCOPY;  Service: Endoscopy;  Laterality: N/A;   HERNIA REPAIR      Home Medications:  Allergies as of 01/17/2024   No Known Allergies      Medication List        Accurate as of January 17, 2024 11:43 AM. If you have any questions, ask your nurse or doctor.          chlorpheniramine-HYDROcodone 10-8 MG/5ML Commonly known as: TUSSIONEX Take 5 mLs by mouth every 12 (twelve) hours as needed for cough.   ondansetron  4 MG disintegrating tablet Commonly known as: ZOFRAN -ODT Take 1 tablet (4 mg total) by mouth every 8 (eight) hours as needed for nausea or vomiting.   oxycodone  5 MG capsule Commonly known as: OXY-IR Take 1 capsule (5 mg total) by mouth every 6 (six) hours as needed for up to 8 doses.   tamsulosin  0.4 MG Caps capsule Commonly known as: FLOMAX  Take 1 capsule (0.4 mg total) by mouth daily.        Allergies:   Allergies[1]  Family History: No family history on file.  Social History:   reports that he has never smoked. He has never used smokeless tobacco. He reports current alcohol use. He reports that he does not use drugs.  Physical Exam: BP 109/78 (BP Location: Left Arm, Patient Position: Sitting, Cuff Size: Large)   Pulse 87   SpO2 96%   Constitutional:  Alert and oriented, no acute distress, nontoxic appearing HEENT: , AT Cardiovascular: No clubbing, cyanosis, or edema Respiratory: Normal respiratory effort, no increased work of breathing Skin: No rashes, bruises or suspicious lesions Neurologic: Grossly intact, no focal deficits, moving all 4 extremities Psychiatric: Normal mood and affect  Laboratory Data: Results for orders placed or performed in visit on 01/17/24  Microscopic Examination   Collection Time: 01/17/24 10:35 AM   Urine  Result Value Ref Range   WBC, UA WILL FOLLOW    RBC, Urine WILL FOLLOW    Epithelial Cells (non renal) WILL FOLLOW    Renal Epithel, UA WILL FOLLOW    Casts WILL FOLLOW    Cast Type WILL FOLLOW    Crystals WILL FOLLOW    Crystal Type WILL FOLLOW    Mucus, UA WILL FOLLOW    Bacteria, UA WILL FOLLOW    Yeast, UA WILL FOLLOW    Trichomonas, UA WILL FOLLOW  Urinalysis Comments WILL FOLLOW   Urinalysis, Complete   Collection Time: 01/17/24 10:35 AM  Result Value Ref Range   Specific Gravity, UA 1.030 1.005 - 1.030   pH, UA 6.0 5.0 - 7.5   Color, UA Yellow Yellow   Appearance Ur Clear Clear   Leukocytes,UA Negative Negative   Protein,UA Negative Negative/Trace   Glucose, UA Negative Negative   Ketones, UA Trace (A) Negative   RBC, UA 2+ (A) Negative   Bilirubin, UA Negative Negative   Urobilinogen, Ur 0.2 0.2 - 1.0 mg/dL   Nitrite, UA Negative Negative   Microscopic Examination See below:    Assessment & Plan:   1. Nephrolithiasis (Primary) Symptomatic, though with persistent microscopic hematuria.  Strongly suspect a retained  stone.  I recommended CT stone study for further evaluation and he agreed. - Urinalysis, Complete - CT RENAL STONE STUDY; Future  Return for Will call with results.  Lucie Hones, PA-C  [facility] [place] [address], [address] [place], [place] [address] [phone]     [1] No Known Allergies

## 2024-01-19 ENCOUNTER — Ambulatory Visit
Admission: RE | Admit: 2024-01-19 | Discharge: 2024-01-19 | Disposition: A | Source: Ambulatory Visit | Attending: Physician Assistant

## 2024-01-19 DIAGNOSIS — N200 Calculus of kidney: Secondary | ICD-10-CM | POA: Diagnosis present

## 2024-01-22 ENCOUNTER — Ambulatory Visit: Payer: Self-pay | Admitting: Urology

## 2024-02-01 NOTE — Telephone Encounter (Signed)
 Advised patient that CT scan results did not show anymore stones and provider recommends a cystoscopy. Appointment was schedule for cysto. Patient verbalized understanding of results and appointment date and time.  Andrea Kirks LPN

## 2024-02-01 NOTE — Telephone Encounter (Signed)
-----   Message from [facility] sent at 01/22/2024  8:42 PM EST ----- Please let Ricky Li know that the CT scan did not show any more stones to explain the microscopic blood in his urine, but we need to make sure there is no problem inside the bladder or urethra.  To  do this, I recommend a cystoscopy.  Would you get him scheduled with Dr. Francisca?

## 2024-02-07 LAB — MICROSCOPIC EXAMINATION

## 2024-02-07 LAB — URINALYSIS, COMPLETE
Bilirubin, UA: NEGATIVE
Glucose, UA: NEGATIVE
Ketones, UA: NEGATIVE
Leukocytes,UA: NEGATIVE
Nitrite, UA: NEGATIVE
Protein,UA: NEGATIVE
Specific Gravity, UA: 1.03 (ref 1.005–1.030)
Urobilinogen, Ur: 0.2 mg/dL (ref 0.2–1.0)
pH, UA: 6 (ref 5.0–7.5)

## 2024-02-28 ENCOUNTER — Other Ambulatory Visit: Admitting: Urology

## 2024-03-15 ENCOUNTER — Other Ambulatory Visit: Admitting: Urology
